# Patient Record
Sex: Male | Born: 1950 | Race: White | Hispanic: No | Marital: Single | Smoking: Former smoker
Health system: Southern US, Community
[De-identification: ages and names within clinical notes are randomized; demographics above are authoritative.]

## PROBLEM LIST (undated history)

## (undated) DIAGNOSIS — E119 Type 2 diabetes mellitus without complications: Secondary | ICD-10-CM

## (undated) DIAGNOSIS — I10 Essential (primary) hypertension: Secondary | ICD-10-CM

## (undated) DIAGNOSIS — I2510 Atherosclerotic heart disease of native coronary artery without angina pectoris: Secondary | ICD-10-CM

## (undated) DIAGNOSIS — I714 Abdominal aortic aneurysm, without rupture, unspecified: Secondary | ICD-10-CM

## (undated) HISTORY — PX: CHOLECYSTECTOMY: SHX55

## (undated) HISTORY — PX: CORONARY ARTERY BYPASS GRAFT: SHX141

## (undated) HISTORY — PX: SP ENDO REPAIR INFRAREN AAA: HXRAD399

---

## 2017-11-23 ENCOUNTER — Other Ambulatory Visit: Payer: Self-pay

## 2017-11-23 ENCOUNTER — Inpatient Hospital Stay (HOSPITAL_COMMUNITY): Payer: Medicare Other

## 2017-11-23 ENCOUNTER — Encounter (HOSPITAL_COMMUNITY): Payer: Self-pay | Admitting: Internal Medicine

## 2017-11-23 ENCOUNTER — Inpatient Hospital Stay (HOSPITAL_COMMUNITY)
Admission: AD | Admit: 2017-11-23 | Discharge: 2017-11-28 | DRG: 872 | Disposition: A | Discharge status: Home / Self Care | Payer: Medicare Other | Source: Other Acute Inpatient Hospital | Attending: Internal Medicine | Admitting: Internal Medicine

## 2017-11-23 DIAGNOSIS — K8050 Calculus of bile duct without cholangitis or cholecystitis without obstruction: Secondary | ICD-10-CM

## 2017-11-23 DIAGNOSIS — I10 Essential (primary) hypertension: Secondary | ICD-10-CM | POA: Diagnosis present

## 2017-11-23 DIAGNOSIS — R0902 Hypoxemia: Secondary | ICD-10-CM | POA: Diagnosis present

## 2017-11-23 DIAGNOSIS — R748 Abnormal levels of other serum enzymes: Secondary | ICD-10-CM | POA: Diagnosis present

## 2017-11-23 DIAGNOSIS — R7989 Other specified abnormal findings of blood chemistry: Secondary | ICD-10-CM | POA: Diagnosis present

## 2017-11-23 DIAGNOSIS — E876 Hypokalemia: Secondary | ICD-10-CM | POA: Diagnosis not present

## 2017-11-23 DIAGNOSIS — K8309 Other cholangitis: Secondary | ICD-10-CM

## 2017-11-23 DIAGNOSIS — K921 Melena: Secondary | ICD-10-CM | POA: Diagnosis not present

## 2017-11-23 DIAGNOSIS — A419 Sepsis, unspecified organism: Secondary | ICD-10-CM | POA: Diagnosis present

## 2017-11-23 DIAGNOSIS — K8030 Calculus of bile duct with cholangitis, unspecified, without obstruction: Secondary | ICD-10-CM | POA: Diagnosis present

## 2017-11-23 DIAGNOSIS — D649 Anemia, unspecified: Secondary | ICD-10-CM | POA: Diagnosis present

## 2017-11-23 DIAGNOSIS — K5710 Diverticulosis of small intestine without perforation or abscess without bleeding: Secondary | ICD-10-CM | POA: Diagnosis present

## 2017-11-23 DIAGNOSIS — Z86718 Personal history of other venous thrombosis and embolism: Secondary | ICD-10-CM

## 2017-11-23 DIAGNOSIS — E119 Type 2 diabetes mellitus without complications: Secondary | ICD-10-CM | POA: Diagnosis present

## 2017-11-23 DIAGNOSIS — R17 Unspecified jaundice: Secondary | ICD-10-CM | POA: Diagnosis not present

## 2017-11-23 DIAGNOSIS — Z79899 Other long term (current) drug therapy: Secondary | ICD-10-CM | POA: Diagnosis not present

## 2017-11-23 DIAGNOSIS — I480 Paroxysmal atrial fibrillation: Secondary | ICD-10-CM | POA: Diagnosis not present

## 2017-11-23 DIAGNOSIS — K831 Obstruction of bile duct: Secondary | ICD-10-CM

## 2017-11-23 DIAGNOSIS — Z7984 Long term (current) use of oral hypoglycemic drugs: Secondary | ICD-10-CM | POA: Diagnosis not present

## 2017-11-23 DIAGNOSIS — I5030 Unspecified diastolic (congestive) heart failure: Secondary | ICD-10-CM | POA: Diagnosis not present

## 2017-11-23 DIAGNOSIS — A4151 Sepsis due to Escherichia coli [E. coli]: Secondary | ICD-10-CM | POA: Diagnosis present

## 2017-11-23 DIAGNOSIS — Z87891 Personal history of nicotine dependence: Secondary | ICD-10-CM | POA: Diagnosis not present

## 2017-11-23 DIAGNOSIS — Z23 Encounter for immunization: Secondary | ICD-10-CM | POA: Diagnosis present

## 2017-11-23 DIAGNOSIS — Z7982 Long term (current) use of aspirin: Secondary | ICD-10-CM

## 2017-11-23 DIAGNOSIS — R945 Abnormal results of liver function studies: Secondary | ICD-10-CM | POA: Diagnosis present

## 2017-11-23 DIAGNOSIS — R6520 Severe sepsis without septic shock: Secondary | ICD-10-CM | POA: Diagnosis present

## 2017-11-23 DIAGNOSIS — I4891 Unspecified atrial fibrillation: Secondary | ICD-10-CM | POA: Diagnosis present

## 2017-11-23 DIAGNOSIS — N179 Acute kidney failure, unspecified: Secondary | ICD-10-CM | POA: Diagnosis not present

## 2017-11-23 DIAGNOSIS — Z951 Presence of aortocoronary bypass graft: Secondary | ICD-10-CM | POA: Diagnosis not present

## 2017-11-23 DIAGNOSIS — I2510 Atherosclerotic heart disease of native coronary artery without angina pectoris: Secondary | ICD-10-CM | POA: Diagnosis present

## 2017-11-23 DIAGNOSIS — D696 Thrombocytopenia, unspecified: Secondary | ICD-10-CM | POA: Diagnosis present

## 2017-11-23 DIAGNOSIS — R7881 Bacteremia: Secondary | ICD-10-CM

## 2017-11-23 DIAGNOSIS — I714 Abdominal aortic aneurysm, without rupture, unspecified: Secondary | ICD-10-CM | POA: Diagnosis present

## 2017-11-23 DIAGNOSIS — E1159 Type 2 diabetes mellitus with other circulatory complications: Secondary | ICD-10-CM | POA: Diagnosis present

## 2017-11-23 DIAGNOSIS — Z7901 Long term (current) use of anticoagulants: Secondary | ICD-10-CM

## 2017-11-23 HISTORY — DX: Abdominal aortic aneurysm, without rupture: I71.4

## 2017-11-23 HISTORY — DX: Type 2 diabetes mellitus without complications: E11.9

## 2017-11-23 HISTORY — DX: Essential (primary) hypertension: I10

## 2017-11-23 HISTORY — DX: Abdominal aortic aneurysm, without rupture, unspecified: I71.40

## 2017-11-23 HISTORY — DX: Atherosclerotic heart disease of native coronary artery without angina pectoris: I25.10

## 2017-11-23 LAB — BASIC METABOLIC PANEL
ANION GAP: 11 (ref 5–15)
BUN: 21 mg/dL (ref 8–23)
CALCIUM: 7 mg/dL — AB (ref 8.9–10.3)
CHLORIDE: 107 mmol/L (ref 98–111)
CO2: 19 mmol/L — AB (ref 22–32)
Creatinine, Ser: 1.28 mg/dL — ABNORMAL HIGH (ref 0.61–1.24)
GFR calc non Af Amer: 56 mL/min — ABNORMAL LOW (ref >60)
GLUCOSE: 159 mg/dL — AB (ref 70–99)
Potassium: 4.1 mmol/L (ref 3.5–5.1)
Sodium: 137 mmol/L (ref 135–145)

## 2017-11-23 LAB — URINALYSIS, ROUTINE W REFLEX MICROSCOPIC
BACTERIA UA: NONE SEEN
Glucose, UA: NEGATIVE mg/dL
HGB URINE DIPSTICK: NEGATIVE
Ketones, ur: NEGATIVE mg/dL
LEUKOCYTES UA: NEGATIVE
NITRITE: NEGATIVE
PROTEIN: 30 mg/dL — AB
Specific Gravity, Urine: 1.028 (ref 1.005–1.030)
pH: 5 (ref 5.0–8.0)

## 2017-11-23 LAB — GLUCOSE, CAPILLARY
GLUCOSE-CAPILLARY: 114 mg/dL — AB (ref 70–99)
Glucose-Capillary: 144 mg/dL — ABNORMAL HIGH (ref 70–99)
Glucose-Capillary: 148 mg/dL — ABNORMAL HIGH (ref 70–99)
Glucose-Capillary: 149 mg/dL — ABNORMAL HIGH (ref 70–99)
Glucose-Capillary: 137 mg/dL — ABNORMAL HIGH (ref 70–99)
Glucose-Capillary: 103 mg/dL — ABNORMAL HIGH (ref 70–99)

## 2017-11-23 LAB — HIV ANTIBODY (ROUTINE TESTING W REFLEX): HIV Screen 4th Generation wRfx: NONREACTIVE

## 2017-11-23 LAB — CBC WITH DIFFERENTIAL/PLATELET
BASOS ABS: 0.1 10*3/uL (ref 0.0–0.1)
Basophils Relative: 1 %
EOS ABS: 0.1 10*3/uL (ref 0.0–0.7)
Eosinophils Relative: 1 %
HCT: 34.2 % — ABNORMAL LOW (ref 39.0–52.0)
HEMOGLOBIN: 11.5 g/dL — AB (ref 13.0–17.0)
LYMPHS PCT: 3 %
Lymphs Abs: 0.4 10*3/uL — ABNORMAL LOW (ref 0.7–4.0)
MCH: 29.6 pg (ref 26.0–34.0)
MCHC: 33.6 g/dL (ref 30.0–36.0)
MCV: 87.9 fL (ref 78.0–100.0)
MONOS PCT: 4 %
Monocytes Absolute: 0.6 10*3/uL (ref 0.1–1.0)
NEUTROS ABS: 12.9 10*3/uL — AB (ref 1.7–7.7)
Neutrophils Relative %: 91 %
Platelets: 103 10*3/uL — ABNORMAL LOW (ref 150–400)
RBC: 3.89 MIL/uL — ABNORMAL LOW (ref 4.22–5.81)
RDW: 14.8 % (ref 11.5–15.5)
WBC Morphology: INCREASED
WBC: 14.1 10*3/uL — AB (ref 4.0–10.5)

## 2017-11-23 LAB — PROCALCITONIN: Procalcitonin: 3.73 ng/mL

## 2017-11-23 LAB — LACTIC ACID, PLASMA
LACTIC ACID, VENOUS: 1.9 mmol/L (ref 0.5–1.9)
LACTIC ACID, VENOUS: 1.6 mmol/L (ref 0.5–1.9)

## 2017-11-23 LAB — HEPATIC FUNCTION PANEL
ALBUMIN: 2.6 g/dL — AB (ref 3.5–5.0)
ALK PHOS: 106 U/L (ref 38–126)
ALT: 125 U/L — AB (ref 0–44)
AST: 91 U/L — AB (ref 15–41)
BILIRUBIN DIRECT: 4.1 mg/dL — AB (ref 0.0–0.2)
BILIRUBIN TOTAL: 5.9 mg/dL — AB (ref 0.3–1.2)
Indirect Bilirubin: 1.8 mg/dL — ABNORMAL HIGH (ref 0.3–0.9)
Total Protein: 5.3 g/dL — ABNORMAL LOW (ref 6.5–8.1)

## 2017-11-23 LAB — APTT: aPTT: 35 seconds (ref 24–36)

## 2017-11-23 LAB — MRSA PCR SCREENING: MRSA by PCR: NEGATIVE

## 2017-11-23 LAB — PROTIME-INR
INR: 1.92
Prothrombin Time: 21.8 seconds — ABNORMAL HIGH (ref 11.4–15.2)

## 2017-11-23 MED ORDER — ONDANSETRON HCL 4 MG PO TABS: 4.0000 mg | ORAL_TABLET | Freq: Four times a day (QID) | ORAL | Status: DC | PRN

## 2017-11-23 MED ORDER — VANCOMYCIN HCL IN DEXTROSE 750-5 MG/150ML-% IV SOLN
750.0000 mg | Freq: Two times a day (BID) | INTRAVENOUS | Status: DC
  Filled 2017-11-23: qty 150

## 2017-11-23 MED ORDER — SODIUM CHLORIDE 0.9 % IV SOLN
INTRAVENOUS | Status: DC
  Administered 2017-11-23 – 2017-11-24 (×2): via INTRAVENOUS

## 2017-11-23 MED ORDER — VANCOMYCIN HCL IN DEXTROSE 1-5 GM/200ML-% IV SOLN: 1000.0000 mg | Freq: Once | INTRAVENOUS | Status: DC

## 2017-11-23 MED ORDER — PNEUMOCOCCAL VAC POLYVALENT 25 MCG/0.5ML IJ INJ
0.5000 mL | INJECTION | INTRAMUSCULAR | Status: AC
  Administered 2017-11-24: 0.5 mL via INTRAMUSCULAR
  Filled 2017-11-23: qty 0.5

## 2017-11-23 MED ORDER — PIPERACILLIN-TAZOBACTAM 3.375 G IVPB
3.3750 g | Freq: Three times a day (TID) | INTRAVENOUS | Status: DC
  Administered 2017-11-23 – 2017-11-25 (×8): 3.375 g via INTRAVENOUS
  Filled 2017-11-23 (×9): qty 50

## 2017-11-23 MED ORDER — ACETAMINOPHEN 325 MG PO TABS
650.0000 mg | ORAL_TABLET | Freq: Four times a day (QID) | ORAL | Status: DC | PRN
  Administered 2017-11-24: 650 mg via ORAL
  Filled 2017-11-23: qty 2

## 2017-11-23 MED ORDER — MORPHINE SULFATE (PF) 2 MG/ML IV SOLN
2.0000 mg | INTRAVENOUS | Status: DC | PRN
  Administered 2017-11-23: 2 mg via INTRAVENOUS
  Filled 2017-11-23: qty 1

## 2017-11-23 MED ORDER — INSULIN ASPART 100 UNIT/ML ~~LOC~~ SOLN
0.0000 [IU] | SUBCUTANEOUS | Status: DC
  Administered 2017-11-23 (×3): 1 [IU] via SUBCUTANEOUS
  Administered 2017-11-24 (×2): 7 [IU] via SUBCUTANEOUS
  Administered 2017-11-24: 1 [IU] via SUBCUTANEOUS
  Administered 2017-11-25: 5 [IU] via SUBCUTANEOUS
  Administered 2017-11-25: 7 [IU] via SUBCUTANEOUS
  Administered 2017-11-25: 3 [IU] via SUBCUTANEOUS

## 2017-11-23 MED ORDER — SODIUM CHLORIDE 0.9 % IV SOLN
INTRAVENOUS | Status: DC
  Administered 2017-11-23 – 2017-11-26 (×4): via INTRAVENOUS

## 2017-11-23 MED ORDER — ACETAMINOPHEN 650 MG RE SUPP: 650.0000 mg | Freq: Four times a day (QID) | RECTAL | Status: DC | PRN

## 2017-11-23 MED ORDER — PIPERACILLIN-TAZOBACTAM 3.375 G IVPB 30 MIN: 3.3750 g | Freq: Once | INTRAVENOUS | Status: DC

## 2017-11-23 MED ORDER — ONDANSETRON HCL 4 MG/2ML IJ SOLN: 4.0000 mg | Freq: Four times a day (QID) | INTRAMUSCULAR | Status: DC | PRN

## 2017-11-23 NOTE — H&P (View-Only) (Signed)
[facility] CONS[facility] Reason for consult: CBD stone Referring Physician: [facility].  Patient transferred from [facility] and accepted by the hospital team.  Perry Cameron is an 67 y.o. male.  HPI: ER in [place] called last night.  I told them that GI does not admit patients and they would need to call the hospital team they wanted to transfer to [facility].  This apparently happened.  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  Since then he has had intermittent symptoms and for the past week has had fairly severe abdominal pain.  His other problems include CABG for CAD, history of endoscopic repair of a infrarenal AAA with the patient being on chronic Eliquis.  For the past week he has been quite sick and has been vomiting and not able to take medications or eat.  He adamantly states that his last dose of Eliquis was Friday morning and is been unable to take any medication since.  His symptoms became worse and he went to the emergency room in [place].  Ultrasound showed dilated duct with CBD 1.4 cm and CTs showed multiple filling defects in the CBD.  Patient apparently appeared to be septic and had a WBC of 13,000 with total bilirubin of 7 and received fluid bolus.  He received a total of 5 L of fluid.  CT scan was done and the report on CT shows dilated extrahepatic duct with at least 3 filling defects.  Patient has been started on broad-spectrum antibiotics and feels much better today with minimal pain or symptoms.  Past Medical History:  Diagnosis Date  . AAA (abdominal aortic aneurysm) ([place])   . Coronary artery disease   . Diabetes mellitus without complication ([place])   . Hypertension     Past Surgical History:  Procedure Laterality Date  . CHOLECYSTECTOMY    . CORONARY ARTERY BYPASS GRAFT    . SP ENDO REPAIR INFRAREN AAA      Family History  Problem Relation Age of Onset  . Diabetes Mellitus II Sister   . CAD Brother    . Diabetes Mellitus II Brother     Social History:  reports that he has quit smoking. He has never used smokeless tobacco. He reports that he drank alcohol. His drug history is not on file.  Allergies: No Known Allergies  Medications; Prior to Admission medications   Not on File   . insulin aspart  0-9 Units Subcutaneous Q4H   PRN Meds acetaminophen **OR** acetaminophen, morphine injection, ondansetron **OR** ondansetron (ZOFRAN) IV Results for orders placed or performed during the hospital encounter of 11/23/17 (from the past 48 hour(s))  Glucose, capillary     Status: Abnormal   Collection Time: 11/23/17  2:18 AM  Result Value Ref Range   Glucose-Capillary 144 (H) 70 - 99 mg/dL  Glucose, capillary     Status: Abnormal   Collection Time: 11/23/17  4:21 AM  Result Value Ref Range   Glucose-Capillary 148 (H) 70 - 99 mg/dL  CBC with Differential     Status: Abnormal   Collection Time: 11/23/17  5:05 AM  Result Value Ref Range   WBC 14.1 (H) 4.0 - 10.5 K/uL   RBC 3.89 (L) 4.22 - 5.81 MIL/uL   Hemoglobin 11.5 (L) 13.0 - 17.0 g/dL   HCT 34.2 (L) 39.0 - 52.0 %   MCV 87.9 78.0 - 100.0 fL   MCH 29.6 26.0 - 34.0 pg   MCHC 33.6 30.0 -  36.0 g/dL   RDW 14.8 11.5 - 15.5 %   Platelets 103 (L) 150 - 400 K/uL    Comment: PLATELET COUNT CONFIRMED BY SMEAR   Neutrophils Relative % 91 %   Lymphocytes Relative 3 %   Monocytes Relative 4 %   Eosinophils Relative 1 %   Basophils Relative 1 %   Neutro Abs 12.9 (H) 1.7 - 7.7 K/uL   Lymphs Abs 0.4 (L) 0.7 - 4.0 K/uL   Monocytes Absolute 0.6 0.1 - 1.0 K/uL   Eosinophils Absolute 0.1 0.0 - 0.7 K/uL   Basophils Absolute 0.1 0.0 - 0.1 K/uL   WBC Morphology INCREASED BANDS (>20% BANDS)     Comment: MILD LEFT SHIFT (1-5% METAS, OCC MYELO, OCC BANDS) VACUOLATED NEUTROPHILS Performed at [facility] [address] [place], [place] [address]   Lactic acid, plasma     Status: None   Collection Time: 11/23/17  5:05 AM  Result Value Ref  Range   Lactic Acid, Venous 1.9 0.5 - 1.9 mmol/L    Comment: Performed at [facility] [address] [place], [place] [address]  Procalcitonin     Status: None   Collection Time: 11/23/17  5:05 AM  Result Value Ref Range   Procalcitonin 3.73 ng/mL    Comment:        Interpretation: PCT > 2 ng/mL: Systemic infection (sepsis) is likely, unless other causes are known. (NOTE)       Sepsis PCT Algorithm           Lower Respiratory Tract                                      Infection PCT Algorithm    ----------------------------     ----------------------------         PCT < 0.25 ng/mL                PCT < 0.10 ng/mL         Strongly encourage             Strongly discourage   discontinuation of antibiotics    initiation of antibiotics    ----------------------------     -----------------------------       PCT 0.25 - 0.50 ng/mL            PCT 0.10 - 0.25 ng/mL               OR       >80% decrease in PCT            Discourage initiation of                                            antibiotics      Encourage discontinuation           of antibiotics    ----------------------------     -----------------------------         PCT >= 0.50 ng/mL              PCT 0.26 - 0.50 ng/mL               AND       <80% decrease in PCT  Encourage initiation of                                             antibiotics       Encourage continuation           of antibiotics    ----------------------------     -----------------------------        PCT >= 0.50 ng/mL                  PCT > 0.50 ng/mL               AND         increase in PCT                  Strongly encourage                                      initiation of antibiotics    Strongly encourage escalation           of antibiotics                                     -----------------------------                                           PCT <= 0.25 ng/mL                                                 OR                                         > 80% decrease in PCT                                     Discontinue / Do not initiate                                             antibiotics Performed at [facility], [address], [place], [place] [address]   Protime-INR     Status: Abnormal   Collection Time: 11/23/17  5:05 AM  Result Value Ref Range   Prothrombin Time 21.8 (H) 11.4 - 15.2 seconds   INR 1.92     Comment: Performed at [facility] [address] [place], [place] [address]  APTT     Status: None   Collection Time: 11/23/17  5:05 AM  Result Value Ref Range   aPTT 35 24 - 36 seconds    Comment: Performed at [facility] [address] [place], [place] [address]  Basic metabolic panel     Status: Abnormal  Collection Time: 11/23/17  5:05 AM  Result Value Ref Range   Sodium 137 135 - 145 mmol/L   Potassium 4.1 3.5 - 5.1 mmol/L   Chloride 107 98 - 111 mmol/L   CO2 19 (L) 22 - 32 mmol/L   Glucose, Bld 159 (H) 70 - 99 mg/dL   BUN 21 8 - 23 mg/dL   Creatinine, Ser 1.28 (H) 0.61 - 1.24 mg/dL   Calcium 7.0 (L) 8.9 - 10.3 mg/dL   GFR calc non Af Amer 56 (L) >60 mL/min   GFR calc Af Amer >60 >60 mL/min    Comment: (NOTE) The eGFR has been calculated using the CKD EPI equation. This calculation has not been validated in all clinical situations. eGFR's persistently <60 mL/min signify possible Chronic Kidney Disease.    Anion gap 11 5 - 15    Comment: Performed at [facility] [address] [place], [place] [address]  Hepatic function panel     Status: Abnormal   Collection Time: 11/23/17  5:05 AM  Result Value Ref Range   Total Protein 5.3 (L) 6.5 - 8.1 g/dL   Albumin 2.6 (L) 3.5 - 5.0 g/dL   AST 91 (H) 15 - 41 U/L   ALT 125 (H) 0 - 44 U/L   Alkaline Phosphatase 106 38 - 126 U/L   Total Bilirubin 5.9 (H) 0.3 - 1.2 mg/dL   Bilirubin, Direct 4.1 (H) 0.0 - 0.2 mg/dL   Indirect Bilirubin 1.8 (H) 0.3 - 0.9 mg/dL    Comment: Performed at [facility] [address], [place], [place] [address]    Dg Chest Port 1 View  Result Date: 11/23/2017 CLINICAL DATA:  Sepsis. EXAM: PORTABLE CHEST 1 VIEW COMPARISON:  None. FINDINGS: Right central line with tip overlying the distal SVC. Post median sternotomy and CABG. Mild hyperinflation. Mild cardiomegaly. Slight increase interstitial markings. No confluent airspace disease. No pneumothorax or large pleural effusion. No acute osseous abnormalities are seen. IMPRESSION: 1. Mild cardiomegaly. Slight increased interstitial markings may be chronic or mild pulmonary edema. 2. Tip of the right central line overlying the distal SVC. Electronically Signed   By: Jeb Levering M.D.   On: 11/23/2017 04:19              Blood pressure 116/71, pulse (!) 128, temperature 98.4 F (36.9 C), resp. rate 16, height 5' 11"  (1.803 m), weight 83 kg (182 lb 15.7 oz), SpO2 (!) 84 %.  Physical exam:   General--alert white male in no acute distress ENT--slightly icteric Neck--full range of motion Heart--regular rate and rhythm without murmurs or gallops Lungs--clear Abdomen--nondistended soft and nontender with good bowel sounds Psych--alert and oriented   Assessment: 1.  Sepsis secondary to cholangitis.  Patient has obvious stones in the CBD on CT scan and appropriate labs.  He will need an ERCP and sphincterotomy with stone removal or at least placement of the stent.  He has been on Eliquis but his last dose was  just 2 days ago  Plan: We will continue his current antibiotics and keep him n.p.o.  Would like to get him off of the Eliquis completely and will therefore try to set up ERCP with stone extraction or stent placement tomorrow with Dr. Paulita Fujita.  Have discussed the procedure including the risk of bleeding and pancreatitis with the patient.  He understands.   Nancy Fetter 11/23/2017, 6:57 AM   This note was created using voice recognition software and minor errors may  Have occurred  unintentionally. Pager: [phone] If no answer or after hours call [phone]

## 2017-11-23 NOTE — Progress Notes (Signed)
RN received call from [facility] at [facility] in [place], 3/4 blood cultures (+) E. Coli. MD made aware.

## 2017-11-23 NOTE — Anesthesia Preprocedure Evaluation (Addendum)
Anesthesia Evaluation  Patient identified by MRN, date of birth, ID band Patient awake    Reviewed: Allergy & Precautions, NPO status , Patient's Chart, lab work & pertinent test results  Airway Mallampati: III  TM Distance: >3 FB Neck ROM: Full    Dental  (+) Edentulous Upper, Dental Advisory Given   Pulmonary former smoker,    breath sounds clear to auscultation       Cardiovascular hypertension, Pt. on medications and Pt. on home beta blockers + CAD, + CABG and + Peripheral Vascular Disease   Rhythm:Regular Rate:Normal     Neuro/Psych negative neurological ROS     GI/Hepatic negative GI ROS, Neg liver ROS,   Endo/Other  diabetes, Type 2, Oral Hypoglycemic Agents  Renal/GU negative Renal ROS     Musculoskeletal negative musculoskeletal ROS (+)   Abdominal Normal abdominal exam  (+)   Peds  Hematology   Anesthesia Other Findings S/p AAA Repair  Reproductive/Obstetrics                            Lab Results  Component Value Date   WBC 14.1 (H) 11/23/2017   HGB 11.5 (L) 11/23/2017   HCT 34.2 (L) 11/23/2017   MCV 87.9 11/23/2017   PLT 103 (L) 11/23/2017   Lab Results  Component Value Date   CREATININE 1.28 (H) 11/23/2017   BUN 21 11/23/2017   NA 137 11/23/2017   K 4.1 11/23/2017   CL 107 11/23/2017   CO2 19 (L) 11/23/2017   Lab Results  Component Value Date   INR 1.92 11/23/2017     Anesthesia Physical Anesthesia Plan  ASA: III  Anesthesia Plan: General   Post-op Pain Management:    Induction: Intravenous  PONV Risk Score and Plan: 3 and Ondansetron, Midazolam and Treatment may vary due to age or medical condition  Airway Management Planned: Oral ETT  Additional Equipment: None  Intra-op Plan:   Post-operative Plan: Extubation in OR  Informed Consent: I have reviewed the patients History and Physical, chart, labs and discussed the procedure including the  risks, benefits and alternatives for the proposed anesthesia with the patient or authorized representative who has indicated his/her understanding and acceptance.   Dental advisory given  Plan Discussed with: CRNA  Anesthesia Plan Comments:        Anesthesia Quick Evaluation

## 2017-11-23 NOTE — H&P (Signed)
History and Physical    Perry DrapeRicky Lee Cameron VHQ:[id number] DOB: 10/26/1950 DOA: 11/23/2017  PCP: No primary care provider on file.  Patient coming from: Patient was transferred from [place].  Chief Complaint: Abdominal pain.  HPI: Perry Cameron is a 67 y.o. male with history of CAD status post CABG, hypertension, diabetes mellitus type 2, abdominal aortic aneurysm status post repair with history of embolism to left femoral artery status post embolectomy this February 5 months ago on apixaban which patient has not taken for last 2 to 3 days due to persistent nausea vomiting abdominal pain.  Patient states he had cholecystectomy 3 years ago and since then he has developed some constant chronic abdominal discomfort mostly in the epigastric area.  But over the last 3 days pain is acutely worsened with persistent nausea and vomiting.  Denies any diarrhea.  Pain is mostly in the epigastric area.  Has had some subjective feeling of fever and chills.  Denies any chest pain or shortness of breath or productive cough.  [facility] patient was initially hypotensive look septic with the labs showing WBC count of 13,000 total bilirubin of 7 ALT of 200 AST of 170 albumin was 3.2 creatinine of 1.4 lipase normal INR 2.2 platelets 141 hemoglobin 13 patient was placed on subclavian central line and at least 5 L of fluid bolus was given following which patient blood pressure improved.  CAT scan done showed extra and intrahepatic biliary duct dilation with possible obstruction distal CBD.  Since officially does not have any gastroenterologist gastro[facility] Dr. Randa EvensEdwards was consulted and was accepted for further [facility] and may need an ERCP.  On my exam patient states his abdominal pain is resolved but is pending to be coming back.  Pain is mostly epigastric radiating across the upper part of the abdomen.  Persistent nausea vomiting  unable to take his medications.  Denies chest pain shortness of breath productive cough or diarrhea.  ED Course: Patient was a direct admit.  Review of Systems: As per HPI, rest all negative.   Past Medical History:  Diagnosis Date  . AAA (abdominal aortic aneurysm) ([facility])   . Coronary artery disease   . Diabetes mellitus without complication ([facility])   . Hypertension     Past Surgical History:  Procedure Laterality Date  . CHOLECYSTECTOMY    . CORONARY ARTERY BYPASS GRAFT    . SP ENDO REPAIR INFRAREN AAA       reports that he has quit smoking. He has never used smokeless tobacco. He reports that he drank alcohol. His drug history is not on file.  Not on File  Family History  Problem Relation Age of Onset  . Diabetes Mellitus II Sister   . CAD Brother   . Diabetes Mellitus II Brother     Prior to Admission medications   Not on File    Physical Exam: Vitals:   11/23/17 0215  BP: 116/71  Pulse: (!) 128  Resp: 16  Temp: 98.2 F (36.8 C)  TempSrc: Oral  SpO2: (!) 84%      Constitutional: Moderately built and nourished. Vitals:   11/23/17 0215  BP: 116/71  Pulse: (!) 128  Resp: 16  Temp: 98.2 F (36.8 C)  TempSrc: Oral  SpO2: (!) 84%   Eyes: Anicteric no pallor. ENMT: No discharge from the ears eyes nose or mouth. Neck: No mass felt.  No neck rigidity.  No  JVD appreciated. Respiratory: No rhonchi or crepitations. Cardiovascular: S1-S2 heard no murmurs appreciated. Abdomen: Soft nontender bowel sounds present.  No guarding or rigidity. Musculoskeletal: No edema.  No joint effusion. Skin: No rash.  Skin appears warm. Neurologic: Alert awake oriented to time place and person.  Moves all extremities. Psychiatric: Appears normal per normal affect.   Labs on Admission: I have personally reviewed following labs and imaging studies  CBC: No results for input(s): WBC, NEUTROABS, HGB, HCT, MCV, PLT in the last 168 hours. Basic Metabolic Panel: No results  for input(s): NA, K, CL, CO2, GLUCOSE, BUN, CREATININE, CALCIUM, MG, PHOS in the last 168 hours. GFR: CrCl cannot be calculated (No order found.). Liver Function Tests: No results for input(s): AST, ALT, ALKPHOS, BILITOT, PROT, ALBUMIN in the last 168 hours. No results for input(s): LIPASE, AMYLASE in the last 168 hours. No results for input(s): AMMONIA in the last 168 hours. Coagulation Profile: No results for input(s): INR, PROTIME in the last 168 hours. Cardiac Enzymes: No results for input(s): CKTOTAL, CKMB, CKMBINDEX, TROPONINI in the last 168 hours. BNP (last 3 results) No results for input(s): PROBNP in the last 8760 hours. HbA1C: No results for input(s): HGBA1C in the last 72 hours. CBG: Recent Labs  Lab 11/23/17 0218  GLUCAP 144*   Lipid Profile: No results for input(s): CHOL, HDL, LDLCALC, TRIG, CHOLHDL, LDLDIRECT in the last 72 hours. Thyroid Function Tests: No results for input(s): TSH, T4TOTAL, FREET4, T3FREE, THYROIDAB in the last 72 hours. Anemia Panel: No results for input(s): VITAMINB12, FOLATE, FERRITIN, TIBC, IRON, RETICCTPCT in the last 72 hours. Urine analysis: No results found for: COLORURINE, APPEARANCEUR, LABSPEC, PHURINE, GLUCOSEU, HGBUR, BILIRUBINUR, KETONESUR, PROTEINUR, UROBILINOGEN, NITRITE, LEUKOCYTESUR Sepsis Labs: @LABRCNTIP (procalcitonin:4,lacticidven:4) )No results found for this or any previous visit (from the past 240 hour(s)).   Radiological Exams on Admission: No results found.  EKG: Independently reviewed.  EKG done at another hospital showing normal sinus rhythm.  Assessment/Plan Principal Problem:   Sepsis (HCC) Active Problems:   Jaundice   Elevated LFTs   CAD (coronary artery disease)   AAA (abdominal aortic aneurysm) ([facility])   Essential hypertension   Type 2 diabetes mellitus with vascular disease ([facility])    1. Sepsis secondary to most likely intra-abdominal cause possible cholangitis with possible obstruction of the CBD -Dr.  Randa Evens on-call gastroenterologist has been consulted.  Will keep patient n.p.o. in anticipation of possible ERCP.  Repeat labs including LFTs CBC metabolic panel are pending.  Continue with hydration and I have placed patient on empiric antibiotics. 2. Diabetes mellitus type 2 we will keep patient on sliding scale coverage. 3. Hypertension presently is in septic picture.  Holding antihypertensives. 4. History of CAD status post CABG denies any chest pain. 5. History of abdominal aortic aneurysm status post endovascular repair complicated with embolization to left femoral artery on apixaban which patient has not taken for last 2 days due to persistent vomiting.  In anticipation of procedure we are holding of apixaban.  CT scan does not show any concerning features for the abdominal aortic aneurysm. 6. Anemia no old labs to compare follow CBC. 7. Mild thrombocytopenia likely could be from sepsis follow CBC. 8. Possible chronic kidney disease follow metabolic panel.  All labs including LFTs metabolic panel CBC EKG chest x-ray are pending.  Note that patient has a right subclavian central line placed at that the hospital.   DVT prophylaxis: SCDs. Code Status: Full code. Family Communication: Discussed with patient. Disposition Plan: Home. Consults called: Solicitor.  Admission status: Inpatient.   Eduard Clos MD [facility] Pager [phone].  If 7PM-7AM, please contact night-coverage [email or web address] Password [email or web address]  11/23/2017, 3:46 AM

## 2017-11-23 NOTE — Consult Note (Signed)
[facility] CONSULT Reason for consult: CBD stone Referring Physician: [facility].  Patient transferred from [facility] and accepted by the hospital team.  Perry Cameron is an 67 y.o. male.  HPI: ER in [place] called last night.  I told them that GI does not admit patients and they would need to call the hospital team they wanted to transfer to [facility].  This apparently happened.  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  Since then he has had intermittent symptoms and for the past week has had fairly severe abdominal pain.  His other problems include CABG for CAD, history of endoscopic repair of a infrarenal AAA with the patient being on chronic Eliquis.  For the past week he has been quite sick and has been vomiting and not able to take medications or eat.  He adamantly states that his last dose of Eliquis was Friday morning and is been unable to take any medication since.  His symptoms became worse and he went to the emergency room in [place].  Ultrasound showed dilated duct with CBD 1.4 cm and CTs showed multiple filling defects in the CBD.  Patient apparently appeared to be septic and had a WBC of 13,000 with total bilirubin of 7 and received fluid bolus.  He received a total of 5 L of fluid.  CT scan was done and the report on CT shows dilated extrahepatic duct with at least 3 filling defects.  Patient has been started on broad-spectrum antibiotics and feels much better today with minimal pain or symptoms.  Past Medical History:  Diagnosis Date  . AAA (abdominal aortic aneurysm) ([place])   . Coronary artery disease   . Diabetes mellitus without complication ([name])   . Hypertension     Past Surgical History:  Procedure Laterality Date  . CHOLECYSTECTOMY    . CORONARY ARTERY BYPASS GRAFT    . SP ENDO REPAIR INFRAREN AAA      Family History  Problem Relation Age of Onset  . Diabetes Mellitus II Sister   . CAD Brother    . Diabetes Mellitus II Brother     Social History:  reports that he has quit smoking. He has never used smokeless tobacco. He reports that he drank alcohol. His drug history is not on file.  Allergies: No Known Allergies  Medications; Prior to Admission medications   Not on File   . insulin aspart  0-9 Units Subcutaneous Q4H   PRN Meds acetaminophen **OR** acetaminophen, morphine injection, ondansetron **OR** ondansetron (ZOFRAN) IV Results for orders placed or performed during the hospital encounter of 11/23/17 (from the past 48 hour(s))  Glucose, capillary     Status: Abnormal   Collection Time: 11/23/17  2:18 AM  Result Value Ref Range   Glucose-Capillary 144 (H) 70 - 99 mg/dL  Glucose, capillary     Status: Abnormal   Collection Time: 11/23/17  4:21 AM  Result Value Ref Range   Glucose-Capillary 148 (H) 70 - 99 mg/dL  CBC with Differential     Status: Abnormal   Collection Time: 11/23/17  5:05 AM  Result Value Ref Range   WBC 14.1 (H) 4.0 - 10.5 K/uL   RBC 3.89 (L) 4.22 - 5.81 MIL/uL   Hemoglobin 11.5 (L) 13.0 - 17.0 g/dL   HCT 34.2 (L) 39.0 - 52.0 %   MCV 87.9 78.0 - 100.0 fL   MCH 29.6 26.0 - 34.0 pg   MCHC 33.6 30.0 -  36.0 g/dL   RDW 14.8 11.5 - 15.5 %   Platelets 103 (L) 150 - 400 K/uL    Comment: PLATELET COUNT CONFIRMED BY SMEAR   Neutrophils Relative % 91 %   Lymphocytes Relative 3 %   Monocytes Relative 4 %   Eosinophils Relative 1 %   Basophils Relative 1 %   Neutro Abs 12.9 (H) 1.7 - 7.7 K/uL   Lymphs Abs 0.4 (L) 0.7 - 4.0 K/uL   Monocytes Absolute 0.6 0.1 - 1.0 K/uL   Eosinophils Absolute 0.1 0.0 - 0.7 K/uL   Basophils Absolute 0.1 0.0 - 0.1 K/uL   WBC Morphology INCREASED BANDS (>20% BANDS)     Comment: MILD LEFT SHIFT (1-5% METAS, OCC MYELO, OCC BANDS) VACUOLATED NEUTROPHILS Performed at [facility] [address] [place], [place] [address]   Lactic acid, plasma     Status: None   Collection Time: 11/23/17  5:05 AM  Result Value Ref  Range   Lactic Acid, Venous 1.9 0.5 - 1.9 mmol/L    Comment: Performed at [facility] [address], [place], [place] [address]  Procalcitonin     Status: None   Collection Time: 11/23/17  5:05 AM  Result Value Ref Range   Procalcitonin 3.73 ng/mL    Comment:        Interpretation: PCT > 2 ng/mL: Systemic infection (sepsis) is likely, unless other causes are known. (NOTE)       Sepsis PCT Algorithm           Lower Respiratory Tract                                      Infection PCT Algorithm    ----------------------------     ----------------------------         PCT < 0.25 ng/mL                PCT < 0.10 ng/mL         Strongly encourage             Strongly discourage   discontinuation of antibiotics    initiation of antibiotics    ----------------------------     -----------------------------       PCT 0.25 - 0.50 ng/mL            PCT 0.10 - 0.25 ng/mL               OR       >80% decrease in PCT            Discourage initiation of                                            antibiotics      Encourage discontinuation           of antibiotics    ----------------------------     -----------------------------         PCT >= 0.50 ng/mL              PCT 0.26 - 0.50 ng/mL               AND       <80% decrease in PCT  Encourage initiation of                                             antibiotics       Encourage continuation           of antibiotics    ----------------------------     -----------------------------        PCT >= 0.50 ng/mL                  PCT > 0.50 ng/mL               AND         increase in PCT                  Strongly encourage                                      initiation of antibiotics    Strongly encourage escalation           of antibiotics                                     -----------------------------                                           PCT <= 0.25 ng/mL                                                 OR                                         > 80% decrease in PCT                                     Discontinue / Do not initiate                                             antibiotics Performed at [facility], [address], [place], [place] [address]   Protime-INR     Status: Abnormal   Collection Time: 11/23/17  5:05 AM  Result Value Ref Range   Prothrombin Time 21.8 (H) 11.4 - 15.2 seconds   INR 1.92     Comment: Performed at [facility] [address], [place], [place] [address]  APTT     Status: None   Collection Time: 11/23/17  5:05 AM  Result Value Ref Range   aPTT 35 24 - 36 seconds    Comment: Performed at [facility] [address] [place], [place] [address]  Basic metabolic panel     Status: Abnormal  Collection Time: 11/23/17  5:05 AM  Result Value Ref Range   Sodium 137 135 - 145 mmol/L   Potassium 4.1 3.5 - 5.1 mmol/L   Chloride 107 98 - 111 mmol/L   CO2 19 (L) 22 - 32 mmol/L   Glucose, Bld 159 (H) 70 - 99 mg/dL   BUN 21 8 - 23 mg/dL   Creatinine, Ser 1.28 (H) 0.61 - 1.24 mg/dL   Calcium 7.0 (L) 8.9 - 10.3 mg/dL   GFR calc non Af Amer 56 (L) >60 mL/min   GFR calc Af Amer >60 >60 mL/min    Comment: (NOTE) The eGFR has been calculated using the CKD EPI equation. This calculation has not been validated in all clinical situations. eGFR's persistently <60 mL/min signify possible Chronic Kidney Disease.    Anion gap 11 5 - 15    Comment: Performed at [facility] [address] [place], [place] [address]  Hepatic function panel     Status: Abnormal   Collection Time: 11/23/17  5:05 AM  Result Value Ref Range   Total Protein 5.3 (L) 6.5 - 8.1 g/dL   Albumin 2.6 (L) 3.5 - 5.0 g/dL   AST 91 (H) 15 - 41 U/L   ALT 125 (H) 0 - 44 U/L   Alkaline Phosphatase 106 38 - 126 U/L   Total Bilirubin 5.9 (H) 0.3 - 1.2 mg/dL   Bilirubin, Direct 4.1 (H) 0.0 - 0.2 mg/dL   Indirect Bilirubin 1.8 (H) 0.3 - 0.9 mg/dL    Comment: Performed at [facility] [address] [place], [place] [address]    Dg Chest Port 1 View  Result Date: 11/23/2017 CLINICAL DATA:  Sepsis. EXAM: PORTABLE CHEST 1 VIEW COMPARISON:  None. FINDINGS: Right central line with tip overlying the distal SVC. Post median sternotomy and CABG. Mild hyperinflation. Mild cardiomegaly. Slight increase interstitial markings. No confluent airspace disease. No pneumothorax or large pleural effusion. No acute osseous abnormalities are seen. IMPRESSION: 1. Mild cardiomegaly. Slight increased interstitial markings may be chronic or mild pulmonary edema. 2. Tip of the right central line overlying the distal SVC. Electronically Signed   By: Jeb Levering M.D.   On: 11/23/2017 04:19              Blood pressure 116/71, pulse (!) 128, temperature 98.4 F (36.9 C), resp. rate 16, height 5' 11"  (1.803 m), weight 83 kg (182 lb 15.7 oz), SpO2 (!) 84 %.  Physical exam:   General--alert white male in no acute distress ENT--slightly icteric Neck--full range of motion Heart--regular rate and rhythm without murmurs or gallops Lungs--clear Abdomen--nondistended soft and nontender with good bowel sounds Psych--alert and oriented   Assessment: 1.  Sepsis secondary to cholangitis.  Patient has obvious stones in the CBD on CT scan and appropriate labs.  He will need an ERCP and sphincterotomy with stone removal or at least placement of the stent.  He has been on Eliquis but his last dose was  just 2 days ago  Plan: We will continue his current antibiotics and keep him n.p.o.  Would like to get him off of the Eliquis completely and will therefore try to set up ERCP with stone extraction or stent placement tomorrow with Dr. Paulita Fujita.  Have discussed the procedure including the risk of bleeding and pancreatitis with the patient.  He understands.   Nancy Fetter 11/23/2017, 6:57 AM   This note was created using voice recognition software and minor errors may  Have occurred  unintentionally. Pager: [phone] If no answer or after hours call [phone]

## 2017-11-23 NOTE — Progress Notes (Signed)
PROGRESS NOTE  Perry Cameron ION:[id number] DOB: 1950/11/03 DOA: 11/23/2017 PCP: No primary care provider on file.  HPI/Recap of past 24 hours:  Reports feeling better, still "sore" from n/v No n/v since arrived to [place], no fever, heart rate and blood pressure has improved, no fever,  Last bm yesterday  Assessment/Plan: Principal Problem:   Sepsis (HCC) Active Problems:   Jaundice   Elevated LFTs   CAD (coronary artery disease)   AAA (abdominal aortic aneurysm) (HCC)   Essential hypertension   Type 2 diabetes mellitus with vascular disease (HCC)  Severe sepsis with ecoli bacteremia, source likely from cholangitis /cbd stone -H/o cholecystectomy  46yrs ago -lft elevated -he had central line placed at outside hospital prior to transfer. cxr central line at distal SVC.  cxr "Mild cardiomegaly. Slight increased interstitial markings may be chronic or mild pulmonary edema" -d/c vanc, continue zosyn, ivf, npo -[facility] consulted, to have ercp in am  Acute hypoxia, o2 sat on presentation was 84%, he is on 2liter o2, denies sob, no edema, lung exam clear -cxr "Mild cardiomegaly. Slight increased interstitial markings may be chronic or mild pulmonary edema" -he is npo, on hydration, will close monitor volume status  Anemia /Thrombocytopenia Mild, no baseline Repeat in am  Elevated Cr  Will get UA No baseline, not sure if acute or chronic Repeat lab in am  noninsulin dependent dm2 Home meds metformin held, on ssi here  CAD s/p CABG in 2000 Denies chest pain cxr "Mild cardiomegaly. Slight increased interstitial markings may be chronic or mild pulmonary edema" Close monitor volume status  afib vs sinus tach with pac's Keep on tele  H/o abdominal aortic aneurysm status post endovascular repair (05/2017)complicated with embolization to left femoral artery on apixaban which patient has not taken for last 2 days due to persistent vomiting.  In anticipation of procedure  we are holding of apixaban.  CT scan does not show any concerning features for the abdominal aortic aneurysm.    Code Status: full   Family Communication: patient   Disposition Plan: home with gi clearance, possible on 7/30   Consultants:  [facility]  Procedures:  Cental line placement on 7/27  Ercp planned on 7/29  Antibiotics:  vanc from admission to 7/28  Zosyn from admission   Objective: BP 116/71 (BP Location: Right Arm)   Pulse (!) 128   Temp 98.6 F (37 C) (Oral)   Resp 16   Ht 5\' 11"  (1.803 m)   Wt 83 kg (182 lb 15.7 oz)   SpO2 (!) 84%   BMI 25.52 kg/m   Intake/Output Summary (Last 24 hours) at 11/23/2017 0836 Last data filed at 11/23/2017 0600 Gross per 24 hour  Intake 243.46 ml  Output --  Net 243.46 ml   Filed Weights   11/23/17 0300  Weight: 83 kg (182 lb 15.7 oz)    Exam: Patient is examined daily including today on 11/23/2017, exams remain the same as of yesterday except that has changed    General:  NAD  Cardiovascular: RRR  Respiratory: CTABL  Abdomen: Soft/ND/NT, positive BS  Musculoskeletal: No Edema  Neuro: alert, oriented   Data Reviewed: Basic Metabolic Panel: Recent Labs  Lab 11/23/17 0505  NA 137  K 4.1  CL 107  CO2 19*  GLUCOSE 159*  BUN 21  CREATININE 1.28*  CALCIUM 7.0*   Liver Function Tests: Recent Labs  Lab 11/23/17 0505  AST 91*  ALT 125*  ALKPHOS 106  BILITOT  5.9*  PROT 5.3*  ALBUMIN 2.6*   No results for input(s): LIPASE, AMYLASE in the last 168 hours. No results for input(s): AMMONIA in the last 168 hours. CBC: Recent Labs  Lab 11/23/17 0505  WBC 14.1*  NEUTROABS 12.9*  HGB 11.5*  HCT 34.2*  MCV 87.9  PLT 103*   Cardiac Enzymes:   No results for input(s): CKTOTAL, CKMB, CKMBINDEX, TROPONINI in the last 168 hours. BNP (last 3 results) No results for input(s): BNP in the last 8760 hours.  ProBNP (last 3 results) No results for input(s): PROBNP in the last 8760  hours.  CBG: Recent Labs  Lab 11/23/17 0218 11/23/17 0421 11/23/17 0811  GLUCAP 144* 148* 149*    No results found for this or any previous visit (from the past 240 hour(s)).   Studies: Dg Chest Port 1 View  Result Date: 11/23/2017 CLINICAL DATA:  Sepsis. EXAM: PORTABLE CHEST 1 VIEW COMPARISON:  None. FINDINGS: Right central line with tip overlying the distal SVC. Post median sternotomy and CABG. Mild hyperinflation. Mild cardiomegaly. Slight increase interstitial markings. No confluent airspace disease. No pneumothorax or large pleural effusion. No acute osseous abnormalities are seen. IMPRESSION: 1. Mild cardiomegaly. Slight increased interstitial markings may be chronic or mild pulmonary edema. 2. Tip of the right central line overlying the distal SVC. Electronically Signed   By: Rubye OaksMelanie  Ehinger M.D.   On: 11/23/2017 04:19    Scheduled Meds: . insulin aspart  0-9 Units Subcutaneous Q4H    Continuous Infusions: . sodium chloride 125 mL/hr at 11/23/17 0600  . sodium chloride Stopped (11/23/17 0511)  . piperacillin-tazobactam (ZOSYN)  IV 12.5 mL/hr at 11/23/17 0600  . vancomycin       Time spent: 35mins I have personally reviewed and interpreted on  11/23/2017 daily labs, tele strips, imagings as discussed above under date review session and assessment and plans.  I reviewed all nursing notes, pharmacy notes, consultant notes,  vitals, pertinent old records  I have discussed plan of care as described above with RN , patient  on 11/23/2017   Albertine GratesFang Antoinette Borgwardt MD, PhD  [facility] Pager [phone]. If 7PM-7AM, please contact night-coverage at [email or web address], password [email or web address] 11/23/2017, 8:36 AM  LOS: 0 days

## 2017-11-23 NOTE — Progress Notes (Signed)
Pharmacy Antibiotic Note  Perry DrapeRicky Lee Cameron is a 67 y.o. male admitted on 11/23/2017 with sepsis with possible GI or urinary source.  Pharmacy has been consulted for Vancocin and Zosyn dosing.  [facility].  Pertinent labs from OSH: WBC 13.3, Hgb 13, Plt 141, SCr 1.52, Na 134, glu 222, alb 3.2, bili 7, AST 170, ALT 200, PT/INR 24.8/2.2  Plan: Vancomycin 1500mg  x1 ([facility]2330  [facility] followed by 750mg  IV every 12 hours.  Goal trough 15-20 mcg/mL. Zosyn 3.375g IV q8h (4 hour infusion).  Height: 5\' 11"  (180.3 cm) Weight: 182 lb 15.7 oz (83 kg) IBW/kg (Calculated) : 75.3  Temp (24hrs), Avg:98.3 F (36.8 C), Min:98.2 F (36.8 C), Max:98.4 F (36.9 C)  No Known Allergies   Thank you for allowing pharmacy to be a part of this patient's care.  Perry GamblesVeronda Indria Cameron, PharmD, BCPS  11/23/2017 4:33 AM

## 2017-11-24 ENCOUNTER — Encounter (HOSPITAL_COMMUNITY): Payer: Self-pay

## 2017-11-24 ENCOUNTER — Inpatient Hospital Stay (HOSPITAL_COMMUNITY): Payer: Medicare Other | Admitting: Anesthesiology

## 2017-11-24 ENCOUNTER — Inpatient Hospital Stay (HOSPITAL_COMMUNITY): Payer: Medicare Other

## 2017-11-24 ENCOUNTER — Encounter (HOSPITAL_COMMUNITY)
Admission: AD | Disposition: A | Discharge status: Home / Self Care | Payer: Self-pay | Source: Other Acute Inpatient Hospital | Attending: Internal Medicine

## 2017-11-24 DIAGNOSIS — I480 Paroxysmal atrial fibrillation: Secondary | ICD-10-CM

## 2017-11-24 DIAGNOSIS — I2510 Atherosclerotic heart disease of native coronary artery without angina pectoris: Secondary | ICD-10-CM

## 2017-11-24 DIAGNOSIS — A419 Sepsis, unspecified organism: Secondary | ICD-10-CM

## 2017-11-24 HISTORY — PX: ERCP: SHX5425

## 2017-11-24 HISTORY — PX: SPHINCTEROTOMY: SHX5544

## 2017-11-24 HISTORY — PX: REMOVAL OF STONES: SHX5545

## 2017-11-24 LAB — COMPREHENSIVE METABOLIC PANEL
ALK PHOS: 94 U/L (ref 38–126)
ALT: 92 U/L — ABNORMAL HIGH (ref 0–44)
ANION GAP: 7 (ref 5–15)
AST: 58 U/L — ABNORMAL HIGH (ref 15–41)
Albumin: 2.3 g/dL — ABNORMAL LOW (ref 3.5–5.0)
BILIRUBIN TOTAL: 3.4 mg/dL — AB (ref 0.3–1.2)
BUN: 19 mg/dL (ref 8–23)
CALCIUM: 6.9 mg/dL — AB (ref 8.9–10.3)
CO2: 23 mmol/L (ref 22–32)
Chloride: 109 mmol/L (ref 98–111)
Creatinine, Ser: 1.08 mg/dL (ref 0.61–1.24)
GFR calc non Af Amer: >60 mL/min (ref >60)
Glucose, Bld: 113 mg/dL — ABNORMAL HIGH (ref 70–99)
POTASSIUM: 3.9 mmol/L (ref 3.5–5.1)
SODIUM: 139 mmol/L (ref 135–145)
TOTAL PROTEIN: 5.2 g/dL — AB (ref 6.5–8.1)

## 2017-11-24 LAB — LACTIC ACID, PLASMA: LACTIC ACID, VENOUS: 1.1 mmol/L (ref 0.5–1.9)

## 2017-11-24 LAB — CBC
HEMATOCRIT: 32.4 % — AB (ref 39.0–52.0)
HEMOGLOBIN: 10.7 g/dL — AB (ref 13.0–17.0)
MCH: 29.6 pg (ref 26.0–34.0)
MCHC: 33 g/dL (ref 30.0–36.0)
MCV: 89.8 fL (ref 78.0–100.0)
Platelets: 120 10*3/uL — ABNORMAL LOW (ref 150–400)
RBC: 3.61 MIL/uL — ABNORMAL LOW (ref 4.22–5.81)
RDW: 15.2 % (ref 11.5–15.5)
WBC: 11.7 10*3/uL — ABNORMAL HIGH (ref 4.0–10.5)

## 2017-11-24 LAB — HEMOGLOBIN AND HEMATOCRIT, BLOOD
HCT: 32.3 % — ABNORMAL LOW (ref 39.0–52.0)
Hemoglobin: 10.7 g/dL — ABNORMAL LOW (ref 13.0–17.0)

## 2017-11-24 LAB — GLUCOSE, CAPILLARY
GLUCOSE-CAPILLARY: 119 mg/dL — AB (ref 70–99)
GLUCOSE-CAPILLARY: 142 mg/dL — AB (ref 70–99)
GLUCOSE-CAPILLARY: 331 mg/dL — AB (ref 70–99)
Glucose-Capillary: 104 mg/dL — ABNORMAL HIGH (ref 70–99)
Glucose-Capillary: 109 mg/dL — ABNORMAL HIGH (ref 70–99)
Glucose-Capillary: 318 mg/dL — ABNORMAL HIGH (ref 70–99)

## 2017-11-24 LAB — TYPE AND SCREEN
ABO/RH(D): A POS
Antibody Screen: NEGATIVE

## 2017-11-24 LAB — HEMOGLOBIN A1C
HEMOGLOBIN A1C: 8 % — AB (ref 4.8–5.6)
Mean Plasma Glucose: 182.9 mg/dL

## 2017-11-24 LAB — ABO/RH: ABO/RH(D): A POS

## 2017-11-24 LAB — LIPASE, BLOOD: LIPASE: 62 U/L — AB (ref 11–51)

## 2017-11-24 SURGERY — ERCP, WITH INTERVENTION IF INDICATED
Anesthesia: General

## 2017-11-24 MED ORDER — GLUCAGON HCL RDNA (DIAGNOSTIC) 1 MG IJ SOLR
INTRAMUSCULAR | Status: AC
  Filled 2017-11-24: qty 1

## 2017-11-24 MED ORDER — DEXAMETHASONE SODIUM PHOSPHATE 10 MG/ML IJ SOLN
INTRAMUSCULAR | Status: DC | PRN
  Administered 2017-11-24: 10 mg via INTRAVENOUS

## 2017-11-24 MED ORDER — ONDANSETRON HCL 4 MG/2ML IJ SOLN
INTRAMUSCULAR | Status: DC | PRN
  Administered 2017-11-24: 4 mg via INTRAVENOUS

## 2017-11-24 MED ORDER — DIGOXIN 0.25 MG/ML IJ SOLN
0.1250 mg | Freq: Once | INTRAMUSCULAR | Status: AC
  Administered 2017-11-24: 0.125 mg via INTRAVENOUS
  Filled 2017-11-24 (×3): qty 2

## 2017-11-24 MED ORDER — SUGAMMADEX SODIUM 200 MG/2ML IV SOLN
INTRAVENOUS | Status: DC | PRN
  Administered 2017-11-24: 170 mg via INTRAVENOUS

## 2017-11-24 MED ORDER — METOPROLOL TARTRATE 12.5 MG HALF TABLET
12.5000 mg | ORAL_TABLET | Freq: Two times a day (BID) | ORAL | Status: DC
  Administered 2017-11-24 – 2017-11-28 (×8): 12.5 mg via ORAL
  Filled 2017-11-24 (×9): qty 1

## 2017-11-24 MED ORDER — INDOMETHACIN 50 MG RE SUPP
RECTAL | Status: DC | PRN
  Administered 2017-11-24: 100 mg via RECTAL

## 2017-11-24 MED ORDER — INDOMETHACIN 50 MG RE SUPP
RECTAL | Status: AC
  Filled 2017-11-24: qty 2

## 2017-11-24 MED ORDER — ROCURONIUM BROMIDE 10 MG/ML (PF) SYRINGE
PREFILLED_SYRINGE | INTRAVENOUS | Status: DC | PRN
  Administered 2017-11-24: 30 mg via INTRAVENOUS

## 2017-11-24 MED ORDER — LIDOCAINE 2% (20 MG/ML) 5 ML SYRINGE
INTRAMUSCULAR | Status: DC | PRN
  Administered 2017-11-24: 50 mg via INTRAVENOUS

## 2017-11-24 MED ORDER — PHENYLEPHRINE 40 MCG/ML (10ML) SYRINGE FOR IV PUSH (FOR BLOOD PRESSURE SUPPORT)
PREFILLED_SYRINGE | INTRAVENOUS | Status: DC | PRN
  Administered 2017-11-24: 120 ug via INTRAVENOUS

## 2017-11-24 MED ORDER — SUCCINYLCHOLINE CHLORIDE 200 MG/10ML IV SOSY
PREFILLED_SYRINGE | INTRAVENOUS | Status: DC | PRN
  Administered 2017-11-24: 140 mg via INTRAVENOUS

## 2017-11-24 MED ORDER — FENTANYL CITRATE (PF) 100 MCG/2ML IJ SOLN
INTRAMUSCULAR | Status: DC | PRN
  Administered 2017-11-24 (×2): 50 ug via INTRAVENOUS

## 2017-11-24 MED ORDER — SODIUM CHLORIDE 0.9 % IV SOLN
INTRAVENOUS | Status: DC | PRN
  Administered 2017-11-24: 50 mL

## 2017-11-24 MED ORDER — IOPAMIDOL (ISOVUE-300) INJECTION 61%
INTRAVENOUS | Status: AC
  Filled 2017-11-24: qty 50

## 2017-11-24 MED ORDER — MIDAZOLAM HCL 2 MG/2ML IJ SOLN
INTRAMUSCULAR | Status: DC | PRN
  Administered 2017-11-24: 2 mg via INTRAVENOUS

## 2017-11-24 MED ORDER — PROPOFOL 10 MG/ML IV BOLUS
INTRAVENOUS | Status: DC | PRN
  Administered 2017-11-24: 130 mg via INTRAVENOUS

## 2017-11-24 MED ORDER — ESMOLOL HCL 100 MG/10ML IV SOLN
INTRAVENOUS | Status: DC | PRN
  Administered 2017-11-24: 20 mg via INTRAVENOUS

## 2017-11-24 MED ORDER — SODIUM CHLORIDE 0.9 % IV BOLUS
1000.0000 mL | Freq: Once | INTRAVENOUS | Status: AC
  Administered 2017-11-24: 1000 mL via INTRAVENOUS

## 2017-11-24 NOTE — Anesthesia Postprocedure Evaluation (Signed)
Anesthesia Post Note  Patient: Perry DrapeRicky Lee Hornaday  Procedure(s) Performed: ENDOSCOPIC RETROGRADE CHOLANGIOPANCREATOGRAPHY (ERCP) (N/A ) SPHINCTEROTOMY REMOVAL OF STONES     Patient location during evaluation: PACU Anesthesia Type: General Level of consciousness: awake and alert Pain management: pain level controlled Vital Signs Assessment: post-procedure vital signs reviewed and stable Respiratory status: spontaneous breathing, nonlabored ventilation, respiratory function stable and patient connected to nasal cannula oxygen Cardiovascular status: blood pressure returned to baseline and stable Postop Assessment: no apparent nausea or vomiting Anesthetic complications: no Comments: Pt converted to Afib in recovery, beta blocker administered for rate control, cardiology consulted.     Last Vitals:  Vitals:   11/24/17 1010 11/24/17 1050  BP: 111/75 118/73  Pulse: 73 (!) 105  Resp: 20   Temp:    SpO2: 97%     Last Pain:  Vitals:   11/24/17 1010  TempSrc:   PainSc: 0-No pain                 Shelton SilvasKevin D Hollis

## 2017-11-24 NOTE — Progress Notes (Signed)
Contacted about afib with elevated rates. Patient with sepsis, afib RVR. Soft bp's limit treatment options, also inability to anticoagulate limits rhythm strategy. No great options.  Now that renal function has improved will give a dose of IV digoxin to see if can at least bring rates down some. Continue oral lopressor.    Perry FerryJ Paulanthony Gleaves MD

## 2017-11-24 NOTE — Plan of Care (Signed)
Pt to remain free of falls.

## 2017-11-24 NOTE — Anesthesia Procedure Notes (Signed)
Procedure Name: Intubation Date/Time: 11/24/2017 8:32 AM Performed by: Leonor Liv, CRNA Pre-anesthesia Checklist: Patient identified, Emergency Drugs available, Suction available and Patient being monitored Patient Re-evaluated:Patient Re-evaluated prior to induction Oxygen Delivery Method: Circle System Utilized Preoxygenation: Pre-oxygenation with 100% oxygen Induction Type: IV induction Ventilation: Oral airway inserted - appropriate to patient size and Mask ventilation with difficulty Laryngoscope Size: Mac and 4 Grade View: Grade I Tube type: Oral Tube size: 7.5 mm Number of attempts: 1 Airway Equipment and Method: Stylet and Oral airway Placement Confirmation: ETT inserted through vocal cords under direct vision,  positive ETCO2 and breath sounds checked- equal and bilateral Secured at: 23 cm Tube secured with: Tape Dental Injury: Teeth and Oropharynx as per pre-operative assessment  Comments: Masking difficult due to thick facial hair

## 2017-11-24 NOTE — Op Note (Signed)
[facility] Patient Name: Perry Cameron Procedure Date : 11/24/2017 MRN: [id number] Attending MD: Willis Modena , MD Date of Birth: 01/20/1951 CSN: [id number] Age: 67 Admit Type: Inpatient Procedure:                ERCP Indications:              Common bile duct stone(s), Biliary dilation on                            Computed Tomogram Scan, Bile duct stone on Computed                            Tomogram Scan, Biliary dilation on Ultrasound, Bile                            duct stone on Ultrasound, Suspected ascending                            cholangitis, Jaundice, Elevated liver enzymes Providers:                Willis Modena, MD, Dwain Sarna, RN, Zoila Shutter,                            Technician, Orvilla Fus, CRNA Referring MD:             [facility] Medicines:                General Anesthesia, Zosyn 3.375 g IV, Indomethacin                            100 mg PR Complications:            No immediate complications. Estimated Blood Loss:     Estimated blood loss was minimal. Procedure:                Pre-Anesthesia Assessment:                           - Prior to the procedure, a History and Physical                            was performed, and patient medications and                            allergies were reviewed. The patient's tolerance of                            previous anesthesia was also reviewed. The risks                            and benefits of the procedure and the sedation                            options and risks were discussed with the patient.  All questions were answered, and informed consent                            was obtained. Prior Anticoagulants: The patient has                            taken Eliquis (apixaban), last dose was 3 days                            prior to procedure. ASA Grade Assessment: III - A                            patient with severe systemic disease. After                reviewing the risks and benefits, the patient was                            deemed in satisfactory condition to undergo the                            procedure.                           After obtaining informed consent, the scope was                            passed under direct vision. Throughout the                            procedure, the patient's blood pressure, pulse, and                            oxygen saturations were monitored continuously. The                            [id number]-[id number] ([id number]) Olympus ERCP was introduced                            through the mouth, and used to inject contrast into                            and used to inject contrast into the bile duct. The                            ERCP was accomplished without difficulty. The                            patient tolerated the procedure well. Scope In: Scope Out: Findings:      The scout film was normal. The major papilla in middle of two "kissing"       diverticula. There was a gaping orifice at the major papilla. The bile       duct was deeply cannulated. Contrast was injected. I personally       interpreted the bile duct  images. Ductal flow of contrast was adequate.       Image quality was adequate. Contrast extended to the bifurcation.       Post-cholecystectomy. No evidence of bile leak. The common bile duct was       moderately dilated. The largest diameter was 15 mm. The main bile duct       contained multiple filling defects consistent with a stone. An 8 mm       biliary sphincterotomy was made with a traction (standard)       sphincterotome using blended current. There was no post-sphincterotomy       bleeding. The biliary tree was swept with a 15 mm balloon and basket       starting at the upper third of the main bile duct. Many stones, soft,       each about 6-8 mm in diameter, were removed. No stones remained on       balloon post-occlusion cholangiogram. Impression:                - Twin "kissing" duodenal diverticula.                           - The major papilla appeared to have a gaping                            orifice, consistent with prior recent stone passage.                           - A filling defect consistent with a stone was seen                            on the cholangiogram.                           - The common bile duct was moderately dilated.                           - Choledocholithiasis was found. Complete removal                            was accomplished by biliary sphincterotomy and                            balloon extraction.                           - A biliary sphincterotomy was performed.                           - The biliary tree was swept. Moderate Sedation:      None Recommendation:           - Avoid aspirin and nonsteroidal anti-inflammatory                            medicines for 3 days.                           - Watch for  pancreatitis, bleeding, perforation,                            and cholangitis.                           - Clear liquid diet today.                           - Resume Eliquis (apixaban) at prior dose in 5 days.                           - Watch for pancreatitis, bleeding, perforation,                            and cholangitis.                           Deboraha Sprang- [facility] will follow. Procedure Code(s):        --- Professional ---                           [phone], Endoscopic retrograde                            cholangiopancreatography (ERCP); with removal of                            calculi/debris from biliary/pancreatic duct(s)                           43262, Endoscopic retrograde                            cholangiopancreatography (ERCP); with                            sphincterotomy/papillotomy Diagnosis Code(s):        --- Professional ---                           K83.8, Other specified diseases of biliary tract                           K80.50, Calculus of bile duct without cholangitis                             or cholecystitis without obstruction                           R17, Unspecified jaundice                           R74.8, Abnormal levels of other serum enzymes                           R93.2, Abnormal findings on diagnostic imaging of  liver and biliary tract CPT copyright 2017 American Medical Association. All rights reserved. The codes documented in this report are preliminary and upon coder review may  be revised to meet current compliance requirements. Willis Modena, MD 11/24/2017 9:24:57 AM This report has been signed electronically. Number of Addenda: 0

## 2017-11-24 NOTE — Transfer of Care (Signed)
Immediate Anesthesia Transfer of Care Note  Patient: Perry Cameron  Procedure(s) Performed: ENDOSCOPIC RETROGRADE CHOLANGIOPANCREATOGRAPHY (ERCP) (N/A ) SPHINCTEROTOMY REMOVAL OF STONES  Patient Location: Endoscopy Unit  Anesthesia Type:General  Level of Consciousness: awake, alert  and oriented  Airway & Oxygen Therapy: Patient Spontanous Breathing and Patient connected to face mask oxygen  Post-op Assessment: Report given to RN, Post -op Vital signs reviewed and stable and Patient moving all extremities  Post vital signs: Reviewed and stable  Last Vitals:  Vitals Value Taken Time  BP 122/71 11/24/2017  9:49 AM  Temp    Pulse 105 11/24/2017  9:57 AM  Resp 27 11/24/2017  9:57 AM  SpO2 97 % 11/24/2017  9:57 AM  Vitals shown include unvalidated device data.  Last Pain:  Vitals:   11/24/17 0940  TempSrc: Oral  PainSc: 0-No pain      Patients Stated Pain Goal: 0 (11/24/17 0453)  Complications: No apparent anesthesia complications Patient appears to be in A-fib on monitor, HR 80s-90s. Dr. Hart RochesterHollis notified.

## 2017-11-24 NOTE — Progress Notes (Signed)
Patient had two very dark brown, almost black stools with some bright red blood mixed in. MD notified and received orders for Type and Screen and H&H.

## 2017-11-24 NOTE — Progress Notes (Addendum)
PROGRESS NOTE  Perry DrapeRicky Lee Cameron ZOX:[id number] DOB: 03-14-1951 DOA: 11/23/2017 PCP: System, Pcp Not In  HPI/Recap of past 24 hours:   Has afib after ercp bp stable, denies chest pain   Addendum: 5;20pm RN paged to report patient had 2 loose very dark brown stool with red blood . Will monitor hgb, transfusion if hemodynamically unstable or hgb trending down  Assessment/Plan: Principal Problem:   Sepsis (HCC) Active Problems:   Jaundice   Elevated LFTs   CAD (coronary artery disease)   AAA (abdominal aortic aneurysm) (HCC)   Essential hypertension   Type 2 diabetes mellitus with vascular disease (HCC)  Severe sepsis with ecoli bacteremia, source likely from cholangitis /cbd stone -H/o cholecystectomy  7881yrs ago -he presented to outside hospital with n/v/ab pain and severe sepsis --he had central line placed and received 5liter fluids bolus at outside hospital prior to transfer. cxr here central line at distal SVC.  cxr "Mild cardiomegaly. Slight increased interstitial markings may be chronic or mild pulmonary edema" -d/c vanc, continue zosyn,  - ercp on 7/29 with CBD stone removal and sphincterectomy   Need to follow up blood culture result from [facility] [place]"    Acute hypoxia, o2 sat on presentation was 84%, he is on 2liter o2, denies sob, no edema, lung exam clear -cxr "Mild cardiomegaly. Slight increased interstitial markings may be chronic or mild pulmonary edema" -monitor volume status, wean oxygen as tolerated  Anemia /Thrombocytopenia Mild, no baseline Monitor, Repeat in am  Elevated Cr , likely AKI, No baseline, UA no bacteria Bun/cr 21/1.28 on 7/28,  bun/cr 19/1.09 on 7/29  noninsulin dependent dm2 Home meds metformin held, on ssi here  CAD s/p CABG in 2000 Denies chest pain cxr "Mild cardiomegaly. Slight increased interstitial markings may be chronic or mild pulmonary edema" Close monitor volume status  Afib -denies prior h/o of  afib -Keep on tele, resume lopressor at decreased dose, titrate up if bp allows -keep K.4, mag>2 -anticoagulation need to be held for 5 days post ERCp/sphinctorectomy per GI -will get echocardiogram, cardiology consult  H/o abdominal aortic aneurysm status post endovascular repair (05/2017)complicated with embolization to left femoral artery on apixaban which patient has not taken for last 2 days due to persistent vomiting.  In anticipation of procedure we are holding of apixaban.  CT scan does not show any concerning features for the abdominal aortic aneurysm.    Code Status: full   Family Communication: patient   Disposition Plan: not ready to discharge   Consultants:  [facility]  cardiology  Procedures:  Cental line placement on 7/27  Ercp  on 7/29  Antibiotics:  vanc from admission to 7/28  Zosyn from admission   Objective: BP 111/75   Pulse 73   Temp 97.7 F (36.5 C) (Oral)   Resp 20   Ht 5\' 11"  (1.803 m)   Wt 84.4 kg (186 lb)   SpO2 97%   BMI 25.94 kg/m   Intake/Output Summary (Last 24 hours) at 11/24/2017 1017 Last data filed at 11/24/2017 [id number] Gross per 24 hour  Intake 1234.62 ml  Output 1225 ml  Net 9.62 ml   Filed Weights   11/23/17 0300 11/24/17 0500 11/24/17 0732  Weight: 83 kg (182 lb 15.7 oz) 84.4 kg (186 lb 1.1 oz) 84.4 kg (186 lb)    Exam: Patient is examined daily including today on 11/24/2017, exams remain the same as of yesterday except that has changed    General:  NAD  Cardiovascular: IRRR  Respiratory: CTABL  Abdomen: Soft/ND/NT, positive BS  Musculoskeletal: No Edema  Neuro: alert, oriented   Data Reviewed: Basic Metabolic Panel: Recent Labs  Lab 11/23/17 0505 11/24/17 0307  NA 137 139  K 4.1 3.9  CL 107 109  CO2 19* 23  GLUCOSE 159* 113*  BUN 21 19  CREATININE 1.28* 1.08  CALCIUM 7.0* 6.9*   Liver Function Tests: Recent Labs  Lab 11/23/17 0505 11/24/17 0307  AST 91* 58*  ALT 125* 92*  ALKPHOS 106 94   BILITOT 5.9* 3.4*  PROT 5.3* 5.2*  ALBUMIN 2.6* 2.3*   Recent Labs  Lab 11/24/17 0307  LIPASE 62*   No results for input(s): AMMONIA in the last 168 hours. CBC: Recent Labs  Lab 11/23/17 0505 11/24/17 0307  WBC 14.1* 11.7*  NEUTROABS 12.9*  --   HGB 11.5* 10.7*  HCT 34.2* 32.4*  MCV 87.9 89.8  PLT 103* 120*   Cardiac Enzymes:   No results for input(s): CKTOTAL, CKMB, CKMBINDEX, TROPONINI in the last 168 hours. BNP (last 3 results) No results for input(s): BNP in the last 8760 hours.  ProBNP (last 3 results) No results for input(s): PROBNP in the last 8760 hours.  CBG: Recent Labs  Lab 11/23/17 1618 11/23/17 2000 11/24/17 0003 11/24/17 0424 11/24/17 0945  GLUCAP 114* 103* 109* 119* 104*    Recent Results (from the past 240 hour(s))  MRSA PCR Screening     Status: None   Collection Time: 11/23/17  6:54 AM  Result Value Ref Range Status   MRSA by PCR NEGATIVE NEGATIVE Final    Comment:        The GeneXpert MRSA Assay (FDA approved for NASAL specimens only), is one component of a comprehensive MRSA colonization surveillance program. It is not intended to diagnose MRSA infection nor to guide or monitor treatment for MRSA infections. Performed at [facility], [address], [place], [place] [address]      Studies: No results found.  Scheduled Meds: . [MAR Hold] insulin aspart  0-9 Units Subcutaneous Q4H  . [MAR Hold] pneumococcal 23 valent vaccine  0.5 mL Intramuscular Tomorrow-1000    Continuous Infusions: . sodium chloride 10 mL/hr at 11/24/17 0449  . [MAR Hold] piperacillin-tazobactam (ZOSYN)  IV 3.375 g (11/24/17 0450)     Time spent: , case discussed with cardiology I have personally reviewed and interpreted on  11/24/2017 daily labs, tele strips, imagings as discussed above under date review session and assessment and plans.  I reviewed all nursing notes, pharmacy notes, consultant notes,  vitals, pertinent old records  I  have discussed plan of care as described above with RN , patient  on 11/24/2017   Albertine Grates MD, PhD  [facility] Pager [phone]. If 7PM-7AM, please contact night-coverage at [email or web address], password [facility] 11/24/2017, 10:17 AM  LOS: 1 day

## 2017-11-24 NOTE — Interval H&P Note (Signed)
History and Physical Interval Note:  11/24/2017 8:10 AM  Perry Cameron  has presented today for surgery, with the diagnosis of cholangitis  The various methods of treatment have been discussed with the patient and family. After consideration of risks, benefits and other options for treatment, the patient has consented to  Procedure(s): ENDOSCOPIC RETROGRADE CHOLANGIOPANCREATOGRAPHY (ERCP) (N/A) as a surgical intervention .  The patient's history has been reviewed, patient examined, no change in status, stable for surgery.  I have reviewed the patient's chart and labs.  Questions were answered to the patient's satisfaction.     Perry Cameron M  Assessment:  1.  Bile duct stones on CT. 2.  Dilated bile duct with elevated LFTs. 3.  Cholangitis, improving on antibiotics. 4.  Chronic anticoagulation, apixaban, off for the past three days.  Plan:  1.  ERCP with anticipated biliary sphincterotomy and bile duct stone extraction. 2.  Risks (up to and including bleeding, infection, perforation, pancreatitis that can be complicated by infected necrosis and death), benefits (removal of stones, alleviating blockage, decreasing risk of cholangitis or choledocholithiasis-related pancreatitis), and alternatives (watchful waiting, percutaneous transhepatic cholangiography) of ERCP were explained to patient/family in detail and patient elects to proceed.

## 2017-11-24 NOTE — Progress Notes (Signed)
After patient was extubated for ERCP, patient was found to be in atrial fibrillation. He is not complaining of any chest pain or discomfort. A 12 lead EKG was obtained and Dr. Rochele PagesXui notified. Patient will return to the floor and await further tests.

## 2017-11-24 NOTE — Consult Note (Signed)
Cardiology Consultation:   Patient ID: Perry Cameron; [id number]; 1951/04/10   Admit date: 11/23/2017 Date of Consult: 11/24/2017  Primary Care Provider: System, Pcp Not In Primary Cardiologist: No primary care provider on file.    Patient Profile:   Perry Cameron is a 67 y.o. male with a hx of cholecystectomy, CAD, endoscopic repair of AAA on apixaban for clot who is being seen today for the evaluation of atrial fibrillation at the request of Dr. Erlinda Hong.  History of Present Illness:   Perry Cameron reports no prior history of atrial fibrillation. He has a cardiologist near his home in [place] who reported that everything was normal prior to his recent AAA repair. He denies any chest pain or shortness of breath, no PND or orthopnea. When I met him, he was ambulating in his room after washing up. He did endorse a recent black stool, but denied lightheadedness or dizzyness. His apixaban is being held currently per GI recommendations re: ERCP.  He denies other active concerns at this time. His nausea, vomiting, and abdominal pain are being managed by his primary team and GI.  Past Medical History:  Diagnosis Date  . AAA (abdominal aortic aneurysm) ([place])   . Coronary artery disease   . Diabetes mellitus without complication ([place])   . Hypertension     Past Surgical History:  Procedure Laterality Date  . CHOLECYSTECTOMY    . CORONARY ARTERY BYPASS GRAFT    . SP ENDO REPAIR INFRAREN AAA       Home Medications:  Prior to Admission medications   Medication Sig Start Date End Date Taking? Authorizing Provider  apixaban (ELIQUIS) 5 MG TABS tablet Take 5 mg by mouth 2 (two) times daily.   Yes [provider]  aspirin EC 81 MG tablet Take 81 mg by mouth daily.   Yes [provider]  atorvastatin (LIPITOR) 40 MG tablet Take 40 mg by mouth daily.   Yes [provider]  gabapentin (NEURONTIN) 100 MG capsule Take 100 mg by mouth 3 (three) times daily.   Yes  [provider]  losartan (COZAAR) 50 MG tablet Take 50 mg by mouth daily.   Yes [provider]  metFORMIN (GLUCOPHAGE-XR) 500 MG 24 hr tablet Take 1,000 mg by mouth 2 (two) times daily.   Yes [provider]  metoprolol tartrate (LOPRESSOR) 50 MG tablet Take 50 mg by mouth 2 (two) times daily.   Yes [provider]  sitaGLIPtin (JANUVIA) 100 MG tablet Take 100 mg by mouth daily.   Yes [provider]    Inpatient Medications: Scheduled Meds: . insulin aspart  0-9 Units Subcutaneous Q4H  . metoprolol tartrate  12.5 mg Oral BID   Continuous Infusions: . sodium chloride 10 mL/hr at 11/24/17 0449  . piperacillin-tazobactam (ZOSYN)  IV 3.375 g (11/24/17 1259)   PRN Meds: acetaminophen **OR** acetaminophen, morphine injection, ondansetron **OR** ondansetron (ZOFRAN) IV  Allergies:   No Known Allergies  Social History:   Social History   Socioeconomic History  . Marital status: Single    Spouse name: Not on file  . Number of children: Not on file  . Years of education: Not on file  . Highest education level: Not on file  Occupational History  . Not on file  Social Needs  . Financial resource strain: Not on file  . Food insecurity:    Worry: Not on file    Inability: Not on file  . Transportation needs:    Medical:  Not on file    Non-medical: Not on file  Tobacco Use  . Smoking status: Former Research scientist (life sciences)  . Smokeless tobacco: Never Used  Substance and Sexual Activity  . Alcohol use: Not Currently  . Drug use: Not on file  . Sexual activity: Not on file  Lifestyle  . Physical activity:    Days per week: Not on file    Minutes per session: Not on file  . Stress: Not on file  Relationships  . Social connections:    Talks on phone: Not on file    Gets together: Not on file    Attends religious service: Not on file    Active member of club or organization: Not on file    Attends meetings of clubs or organizations: Not on file     Relationship status: Not on file  . Intimate partner violence:    Fear of current or ex partner: Not on file    Emotionally abused: Not on file    Physically abused: Not on file    Forced sexual activity: Not on file  Other Topics Concern  . Not on file  Social History Narrative  . Not on file    Family History:    Family History  Problem Relation Age of Onset  . Diabetes Mellitus II Sister   . CAD Brother   . Diabetes Mellitus II Brother      ROS:  Please see the history of present illness.  Review of Systems  Constitutional: Positive for chills, fever and malaise/fatigue. Negative for diaphoresis and weight loss.  HENT: Negative for ear discharge, ear pain, hearing loss and nosebleeds.   Eyes: Negative for blurred vision and pain.  Respiratory: Negative for hemoptysis and shortness of breath.   Cardiovascular: Negative for chest pain, palpitations, orthopnea, claudication, leg swelling and PND.  Gastrointestinal: Positive for abdominal pain, melena, nausea and vomiting.  Genitourinary: Negative for hematuria and urgency.  Musculoskeletal: Negative for falls and myalgias.  Skin: Negative for itching and rash.  Neurological: Negative for focal weakness, loss of consciousness and headaches.  Endo/Heme/Allergies: Does not bruise/bleed easily.   All other ROS reviewed and negative.     Physical Exam/Data:   Vitals:   11/24/17 1738 11/24/17 1801 11/24/17 1802 11/24/17 1805  BP:  93/67 (!) 87/66 93/62  Pulse:  (!) 138 (!) 142 (!) 138  Resp:  18 (!) 26 17  Temp: 97.8 F (36.6 C)     TempSrc: Oral     SpO2:  95% 93% 97%  Weight:      Height:        Intake/Output Summary (Last 24 hours) at 11/24/2017 1830 Last data filed at 11/24/2017 7209 Gross per 24 hour  Intake 1234.62 ml  Output 800 ml  Net 434.62 ml   Filed Weights   11/23/17 0300 11/24/17 0500 11/24/17 0732  Weight: 182 lb 15.7 oz (83 kg) 186 lb 1.1 oz (84.4 kg) 186 lb (84.4 kg)   Body mass index is 25.94  kg/m.  General:  Well nourished, well developed, in no acute distress HEENT: normal Lymph: no adenopathy Neck: no JVD Endocrine:  No thryomegaly Vascular: No carotid bruits; FA pulses 2+ bilaterally without bruits  Cardiac:  normal S1, S2; irregularly irregular; no murmur  Lungs:  clear to auscultation bilaterally, no wheezing, rhonchi or rales  Abd: soft, nontender, no hepatomegaly  Ext: no edema Musculoskeletal:  No deformities, BUE and BLE strength normal and equal Skin: warm and dry  Neuro:  CNs 2-12 intact, no focal abnormalities noted Psych:  Normal affect   EKG:  The EKG was personally reviewed and demonstrates:  Initial NSR, now Atrial fibrillation Telemetry:  Telemetry was personally reviewed and demonstrates:  Atrial fibrilaltion  Relevant CV Studies: none  Laboratory Data:  Chemistry Recent Labs  Lab 11/23/17 0505 11/24/17 0307  NA 137 139  K 4.1 3.9  CL 107 109  CO2 19* 23  GLUCOSE 159* 113*  BUN 21 19  CREATININE 1.28* 1.08  CALCIUM 7.0* 6.9*  GFRNONAA 56* >60  GFRAA >60 >60  ANIONGAP 11 7    Recent Labs  Lab 11/23/17 0505 11/24/17 0307  PROT 5.3* 5.2*  ALBUMIN 2.6* 2.3*  AST 91* 58*  ALT 125* 92*  ALKPHOS 106 94  BILITOT 5.9* 3.4*   Hematology Recent Labs  Lab 11/23/17 0505 11/24/17 0307  WBC 14.1* 11.7*  RBC 3.89* 3.61*  HGB 11.5* 10.7*  HCT 34.2* 32.4*  MCV 87.9 89.8  MCH 29.6 29.6  MCHC 33.6 33.0  RDW 14.8 15.2  PLT 103* 120*   Cardiac EnzymesNo results for input(s): TROPONINI in the last 168 hours. No results for input(s): TROPIPOC in the last 168 hours.  BNPNo results for input(s): BNP, PROBNP in the last 168 hours.  DDimer No results for input(s): DDIMER in the last 168 hours.  Radiology/Studies:  Dg Chest Port 1 View  Result Date: 11/23/2017 CLINICAL DATA:  Sepsis. EXAM: PORTABLE CHEST 1 VIEW COMPARISON:  None. FINDINGS: Right central line with tip overlying the distal SVC. Post median sternotomy and CABG. Mild  hyperinflation. Mild cardiomegaly. Slight increase interstitial markings. No confluent airspace disease. No pneumothorax or large pleural effusion. No acute osseous abnormalities are seen. IMPRESSION: 1. Mild cardiomegaly. Slight increased interstitial markings may be chronic or mild pulmonary edema. 2. Tip of the right central line overlying the distal SVC. Electronically Signed   By: Jeb Levering M.D.   On: 11/23/2017 04:19   Dg Ercp Biliary & Pancreatic Ducts  Result Date: 11/24/2017 CLINICAL DATA:  ERCP with sphincterotomy. EXAM: ERCP TECHNIQUE: Multiple spot images obtained with the fluoroscopic device and submitted for interpretation post-procedure. FLUOROSCOPY TIME:  3 minutes, 52 seconds COMPARISON:  None. FINDINGS: 6 spot intraoperative fluoroscopic images of the right upper abdominal quadrant during ERCP are provided for review. Initial image demonstrates an ERCP probe overlying the right upper abdominal quadrant. Subsequent images demonstrate opacification of the common bile duct which appears moderate to markedly dilated. There are at least a 5 filling defects within the common bile duct worrisome for nonocclusive choledocholithiasis Subsequent image demonstrates insufflation of a balloon within the central aspect of the CBD with subsequent presumed biliary sweeping and biliary sphincterotomy. There is minimal opacification of the central aspect of the intrahepatic biliary tree which appears mildly dilated. There is faint opacification of the central aspect of the cystic duct. There is no definitive opacification of the pancreatic duct. IMPRESSION: ERCP with findings of suspected choledocholithiasis with subsequent presumed biliary sweeping and sphincterotomy as above. These images were submitted for radiologic interpretation only. Please see the procedural report for the amount of contrast and the fluoroscopy time utilized. Electronically Signed   By: Sandi Mariscal M.D.   On: 11/24/2017 10:58     Assessment and Plan:   1. Atrial fibrillation: may be driven secondary to his sepsis/abdominal issues. Cannot anticoagulate given plans for ERCP, and he also had melenic stool today which is concerning. He was rate controlled at rest but noticeably had an increase  in heart rate with exertion. He was asymptomatic throughout. -Best option is rate control currently. Can start a low dose of oral metoprolol, though would monitor his blood pressure closely with this. Would start with 12.5 mg BID. Can also give 5 mg IV metoprolol if needed acutely.  -unsure of his EF, echo pending. As this is likely a reactive process, would make sure he is not dehydrated and give IV fluids as needed to keep HR low and blood pressure up -once he is able to be anticoagulated, we can discuss a rhythm control strategy if appropriate. -keep K>4 and Mg >2  We will follow with you.   For questions or updates, please contact [place] Please consult [email or web address] for contact info under Cardiology/STEMI.   Signed, Buford Dresser, MD  11/24/2017 6:30 PM

## 2017-11-24 NOTE — Progress Notes (Signed)
Paged by RN to reports patient's blood pressure is 93/62, heart rate 138, patient denies chest pain, no sob, will bolus ns one liter, further afib management per cardiology.

## 2017-11-25 ENCOUNTER — Encounter (HOSPITAL_COMMUNITY): Payer: Self-pay | Admitting: Gastroenterology

## 2017-11-25 ENCOUNTER — Inpatient Hospital Stay (HOSPITAL_COMMUNITY): Payer: Medicare Other

## 2017-11-25 DIAGNOSIS — I5030 Unspecified diastolic (congestive) heart failure: Secondary | ICD-10-CM

## 2017-11-25 DIAGNOSIS — A4151 Sepsis due to Escherichia coli [E. coli]: Principal | ICD-10-CM

## 2017-11-25 DIAGNOSIS — I4891 Unspecified atrial fibrillation: Secondary | ICD-10-CM

## 2017-11-25 LAB — COMPREHENSIVE METABOLIC PANEL
ALK PHOS: 85 U/L (ref 38–126)
ALT: 67 U/L — AB (ref 0–44)
AST: 28 U/L (ref 15–41)
Albumin: 2.5 g/dL — ABNORMAL LOW (ref 3.5–5.0)
Anion gap: 5 (ref 5–15)
BILIRUBIN TOTAL: 2.3 mg/dL — AB (ref 0.3–1.2)
BUN: 26 mg/dL — ABNORMAL HIGH (ref 8–23)
CO2: 24 mmol/L (ref 22–32)
CREATININE: 1.1 mg/dL (ref 0.61–1.24)
Calcium: 7.3 mg/dL — ABNORMAL LOW (ref 8.9–10.3)
Chloride: 107 mmol/L (ref 98–111)
Glucose, Bld: 298 mg/dL — ABNORMAL HIGH (ref 70–99)
Potassium: 4.4 mmol/L (ref 3.5–5.1)
Sodium: 136 mmol/L (ref 135–145)
Total Protein: 5.6 g/dL — ABNORMAL LOW (ref 6.5–8.1)

## 2017-11-25 LAB — CBC
HCT: 29.5 % — ABNORMAL LOW (ref 39.0–52.0)
Hemoglobin: 9.9 g/dL — ABNORMAL LOW (ref 13.0–17.0)
MCH: 29.1 pg (ref 26.0–34.0)
MCHC: 33.6 g/dL (ref 30.0–36.0)
MCV: 86.8 fL (ref 78.0–100.0)
Platelets: 145 10*3/uL — ABNORMAL LOW (ref 150–400)
RBC: 3.4 MIL/uL — ABNORMAL LOW (ref 4.22–5.81)
RDW: 14.6 % (ref 11.5–15.5)
WBC: 15 10*3/uL — AB (ref 4.0–10.5)

## 2017-11-25 LAB — HEPATITIS PANEL, ACUTE
HCV Ab: <0.1 s/co ratio (ref 0.0–0.9)
HEP B S AG: NEGATIVE
Hep A IgM: NEGATIVE
Hep B C IgM: NEGATIVE

## 2017-11-25 LAB — GLUCOSE, CAPILLARY
GLUCOSE-CAPILLARY: 296 mg/dL — AB (ref 70–99)
GLUCOSE-CAPILLARY: 322 mg/dL — AB (ref 70–99)
Glucose-Capillary: 249 mg/dL — ABNORMAL HIGH (ref 70–99)
Glucose-Capillary: 272 mg/dL — ABNORMAL HIGH (ref 70–99)
Glucose-Capillary: 284 mg/dL — ABNORMAL HIGH (ref 70–99)
Glucose-Capillary: 314 mg/dL — ABNORMAL HIGH (ref 70–99)

## 2017-11-25 LAB — TSH: TSH: 1.543 u[IU]/mL (ref 0.350–4.500)

## 2017-11-25 LAB — ECHOCARDIOGRAM COMPLETE
Height: 71 in
WEIGHTICAEL: 3051.17 [oz_av]

## 2017-11-25 MED ORDER — SODIUM CHLORIDE 0.9 % IV SOLN
3.0000 g | Freq: Four times a day (QID) | INTRAVENOUS | Status: DC
  Administered 2017-11-25 – 2017-11-28 (×12): 3 g via INTRAVENOUS
  Filled 2017-11-25 (×14): qty 3

## 2017-11-25 MED ORDER — INSULIN ASPART 100 UNIT/ML ~~LOC~~ SOLN
0.0000 [IU] | Freq: Three times a day (TID) | SUBCUTANEOUS | Status: DC
  Administered 2017-11-25: 7 [IU] via SUBCUTANEOUS
  Administered 2017-11-25 (×2): 5 [IU] via SUBCUTANEOUS
  Administered 2017-11-26: 2 [IU] via SUBCUTANEOUS
  Administered 2017-11-26: 3 [IU] via SUBCUTANEOUS
  Administered 2017-11-26 (×2): 2 [IU] via SUBCUTANEOUS
  Administered 2017-11-27: 3 [IU] via SUBCUTANEOUS
  Administered 2017-11-27: 2 [IU] via SUBCUTANEOUS
  Administered 2017-11-27: 5 [IU] via SUBCUTANEOUS
  Administered 2017-11-27: 2 [IU] via SUBCUTANEOUS
  Administered 2017-11-28: 5 [IU] via SUBCUTANEOUS
  Administered 2017-11-28: 3 [IU] via SUBCUTANEOUS

## 2017-11-25 MED ORDER — INSULIN ASPART 100 UNIT/ML ~~LOC~~ SOLN: 0.0000 [IU] | Freq: Three times a day (TID) | SUBCUTANEOUS | Status: DC

## 2017-11-25 NOTE — Progress Notes (Signed)
Progress Note  Patient Name: Perry Cameron Date of Encounter: 11/25/2017  Primary Cardiologist: New to [facility]; Dr. Cristal Deer (inpatient), follows with cardiologist in [place]  Subjective   Last evening had episode of afib RVR with associated hypotension. Managed medically, and at 22:24 converted back into normal sinus rhythm. States he has had this happen in the past around stress/perioperatively. Feels well this AM.   Inpatient Medications    Scheduled Meds: . insulin aspart  0-9 Units Subcutaneous TID AC & HS  . metoprolol tartrate  12.5 mg Oral BID   Continuous Infusions: . sodium chloride 10 mL/hr at 11/24/17 2040  . piperacillin-tazobactam (ZOSYN)  IV 3.375 g (11/25/17 0445)   PRN Meds: acetaminophen **OR** acetaminophen, morphine injection, ondansetron **OR** ondansetron (ZOFRAN) IV   Vital Signs    Vitals:   11/25/17 0440 11/25/17 0442 11/25/17 0807 11/25/17 0856  BP:    114/64  Pulse:    61  Resp:    18  Temp: 97.6 F (36.4 C)  97.6 F (36.4 C)   TempSrc: Oral  Oral   SpO2:    94%  Weight:  190 lb 11.2 oz (86.5 kg)    Height:        Intake/Output Summary (Last 24 hours) at 11/25/2017 1117 Last data filed at 11/25/2017 0500 Gross per 24 hour  Intake 232.58 ml  Output -  Net 232.58 ml   Filed Weights   11/24/17 0500 11/24/17 0732 11/25/17 0442  Weight: 186 lb 1.1 oz (84.4 kg) 186 lb (84.4 kg) 190 lb 11.2 oz (86.5 kg)    Telemetry    Atrial fibrillation with RVR noted overnight. Patient maintaining NSR at this time - Personally Reviewed  Physical Exam   GEN: No acute distress.   Neck: No JVD, no carotid bruits Cardiac: RRR, no murmurs, rubs, or gallops.  Respiratory: Clear to auscultation bilaterally, no wheezes/ rales/ rhonchi GI: NABS, Soft, nontender, non-distended  MS: No edema; No deformity. Neuro:  Nonfocal, moving all extremities spontaneously Psych: Normal affect   Labs    Chemistry Recent Labs  Lab 11/23/17 0505  11/24/17 0307 11/25/17 0433  NA 137 139 136  K 4.1 3.9 4.4  CL 107 109 107  CO2 19* 23 24  GLUCOSE 159* 113* 298*  BUN 21 19 26*  CREATININE 1.28* 1.08 1.10  CALCIUM 7.0* 6.9* 7.3*  PROT 5.3* 5.2* 5.6*  ALBUMIN 2.6* 2.3* 2.5*  AST 91* 58* 28  ALT 125* 92* 67*  ALKPHOS 106 94 85  BILITOT 5.9* 3.4* 2.3*  GFRNONAA 56* >60 >60  GFRAA >60 >60 >60  ANIONGAP 11 7 5      Hematology Recent Labs  Lab 11/23/17 0505 11/24/17 0307 11/24/17 1820 11/25/17 0433  WBC 14.1* 11.7*  --  15.0*  RBC 3.89* 3.61*  --  3.40*  HGB 11.5* 10.7* 10.7* 9.9*  HCT 34.2* 32.4* 32.3* 29.5*  MCV 87.9 89.8  --  86.8  MCH 29.6 29.6  --  29.1  MCHC 33.6 33.0  --  33.6  RDW 14.8 15.2  --  14.6  PLT 103* 120*  --  145*    Cardiac EnzymesNo results for input(s): TROPONINI in the last 168 hours. No results for input(s): TROPIPOC in the last 168 hours.   BNPNo results for input(s): BNP, PROBNP in the last 168 hours.   DDimer No results for input(s): DDIMER in the last 168 hours.   Radiology    Dg Ercp Biliary & Pancreatic Ducts  Result Date: 11/24/2017 CLINICAL DATA:  ERCP with sphincterotomy. EXAM: ERCP TECHNIQUE: Multiple spot images obtained with the fluoroscopic device and submitted for interpretation post-procedure. FLUOROSCOPY TIME:  3 minutes, 52 seconds COMPARISON:  None. FINDINGS: 6 spot intraoperative fluoroscopic images of the right upper abdominal quadrant during ERCP are provided for review. Initial image demonstrates an ERCP probe overlying the right upper abdominal quadrant. Subsequent images demonstrate opacification of the common bile duct which appears moderate to markedly dilated. There are at least a 5 filling defects within the common bile duct worrisome for nonocclusive choledocholithiasis Subsequent image demonstrates insufflation of a balloon within the central aspect of the CBD with subsequent presumed biliary sweeping and biliary sphincterotomy. There is minimal opacification of the  central aspect of the intrahepatic biliary tree which appears mildly dilated. There is faint opacification of the central aspect of the cystic duct. There is no definitive opacification of the pancreatic duct. IMPRESSION: ERCP with findings of suspected choledocholithiasis with subsequent presumed biliary sweeping and sphincterotomy as above. These images were submitted for radiologic interpretation only. Please see the procedural report for the amount of contrast and the fluoroscopy time utilized. Electronically Signed   By: Simonne ComeJohn  Watts M.D.   On: 11/24/2017 10:58    Cardiac Studies   Echo pending  Patient Profile     67 y.o. male with a hx of cholecystectomy, CAD, endoscopic repair of AAA on apixaban for clot who is being followed by cardiology for the evaluation of atrial fibrillation   Assessment & Plan    1. New onset atrial fibrillation: patient presented with sepsis/GI issues and was found to be in atrial fibrillation. Likely Afib being driving by acute infection. Patient was started on metoprolol 12.5mg  BID and converted to NSR overnight after an episode of RVR requiring digoxin. BP and HR improved this morning. He was on apixaban outpatient following repair of his AAA - this has been on hold this admission for ERCP and an episode of melena - Continue metoprolol 12.5mg  BID - Resume apixaban 5mg  BID for CHA2DS2-VASc Score of 4 (HTN, DM, Vasc, and age 68-74) when cleared by GI. Hemoglobin is downtrending, reports melena yesterday, so anticoagulation is secondary to current workup. - Echo pending to evaluate LV function - if normal, do not ancipitate further medication adjustments or cardiology work-up  2. Cholangitis: presented with abdominal pain. Found to have ecoli bacteremia likely 2/2 bile duct stone and cholangitis. He is on IV antibiotics and GI is following to assist with care. - Continue management per primary team/GI  [facility] will sign off at this time.  If there are any  additional concerns please page and let us know. Medication Recommendations:  Metoprolol 12.5 mg BID, apixaban when appropriate per GI Other recommendations (labs, testing, etc):  Per primary team Follow up as an outpatient:  [place] already has follow up scheduled.  For questions or updates, please contact [facility] Please consult [email or web address] for contact info under Cardiology/STEMI.   TIME SPENT WITH PATIENT: 25 minutes of direct patient care. More than 50% of that time was spent on coordination of care and counseling regarding atrial fibrillation and anticoagulation.  Jodelle RedBridgette Marlene Beidler, MD, PhD [facility]  [facility]

## 2017-11-25 NOTE — Progress Notes (Signed)
Subjective: No abdominal pain. Couple episodes blood in stool last night, none since.  Objective: Vital signs in last 24 hours: Temp:  [97.6 F (36.4 C)-98.1 F (36.7 C)] 97.6 F (36.4 C) (07/30 1132) Pulse Rate:  [60-142] 61 (07/30 0856) Resp:  [17-26] 18 (07/30 0856) BP: (87-118)/(56-71) 114/64 (07/30 0856) SpO2:  [93 %-97 %] 94 % (07/30 0856) Weight:  [86.5 kg (190 lb 11.2 oz)] 86.5 kg (190 lb 11.2 oz) (07/30 0442) Weight change: -0.031 kg (-1.1 oz) Last BM Date: 11/24/17  PE: GEN:  NAD HEENT:  Faint scleral icterus ABD:  Soft, non-tender  Lab Results: CBC    Component Value Date/Time   WBC 15.0 (H) 11/25/2017 0433   RBC 3.40 (L) 11/25/2017 0433   HGB 9.9 (L) 11/25/2017 0433   HCT 29.5 (L) 11/25/2017 0433   PLT 145 (L) 11/25/2017 0433   MCV 86.8 11/25/2017 0433   MCH 29.1 11/25/2017 0433   MCHC 33.6 11/25/2017 0433   RDW 14.6 11/25/2017 0433   LYMPHSABS 0.4 (L) 11/23/2017 0505   MONOABS 0.6 11/23/2017 0505   EOSABS 0.1 11/23/2017 0505   BASOSABS 0.1 11/23/2017 0505   CMP     Component Value Date/Time   NA 136 11/25/2017 0433   K 4.4 11/25/2017 0433   CL 107 11/25/2017 0433   CO2 24 11/25/2017 0433   GLUCOSE 298 (H) 11/25/2017 0433   BUN 26 (H) 11/25/2017 0433   CREATININE 1.10 11/25/2017 0433   CALCIUM 7.3 (L) 11/25/2017 0433   PROT 5.6 (L) 11/25/2017 0433   ALBUMIN 2.5 (L) 11/25/2017 0433   AST 28 11/25/2017 0433   ALT 67 (H) 11/25/2017 0433   ALKPHOS 85 11/25/2017 0433   BILITOT 2.3 (H) 11/25/2017 0433   GFRNONAA >60 11/25/2017 0433   GFRAA >60 11/25/2017 0433   Assessment:  1.  Choledocholithiasis with cholangitis.  ERCP yesterday with multiple CBD stones removed. 2.  Elevated LFTs, likely from #1 above, improving. 3.  Blood in stool with slight interval decrease in Hemoglobin.  Concern for post-sphincterotomy bleeding, but no blood in stool since last night. 4.  Atrial fibrillation, now rate- and BP-controlled. 5.  Chronic anticoagulation,  apixaban, which is currently on hold.  Plan:  1.  Continue antibiotics. 2.  Follow Hgb and watch for bleeding; if has ongoing blood in stool and drop hemoglobin over the next couple days, may have to consider EGD with side-viewing endoscope (hemospray might be helpful in this setting). 3.  Continue clear liquid diet for now, do not advance. 4.  [facility] will follow.   Perry JakschOUTLAW,Leinani Lisbon M 11/25/2017, 12:08 PM   Cell [phone] If no answer or after 5 PM call [phone]

## 2017-11-25 NOTE — Progress Notes (Signed)
PROGRESS NOTE  Perry Cameron ZOX:[id number] DOB: [birth year] DOA: 11/23/2017 PCP: System, Pcp Not In  Brief Summary:  he presented to outside hospital with n/v/ab pain and severe sepsis he had central line placed and received 5liter fluids bolus at outside hospital prior to transfer. Found to have cholangitis, E. coli bacteremia and A. Fib. Cardiology and [facility] following   HPI/Recap of past 24 hours:  He received digoxin last night,  he  converted to sinus rhythm this am  He has blood in stool after ERCP bp stable, denies chest pain or abdominal pain, no sob, no hypoxia, he is now on room air   Assessment/Plan: Principal Problem:   Sepsis (HCC) Active Problems:   Jaundice   Elevated LFTs   CAD (coronary artery disease)   AAA (abdominal aortic aneurysm) (HCC)   Essential hypertension   Type 2 diabetes mellitus with vascular disease (HCC)  Severe sepsis with ecoli bacteremia, source likely from cholangitis /cbd stone -H/o cholecystectomy  40yrs ago -he presented to outside hospital with n/v/ab pain and severe sepsis --he had central line placed and received 5liter fluids bolus at outside hospital prior to transfer. cxr here central line at distal SVC.  cxr "Mild cardiomegaly. Slight increased interstitial markings may be chronic or mild pulmonary edema" He received vanc initially, he has been on  zosyn, blood culture from outside hospital finalized today with pansensitive E. Coli, antibiotic change to Unasyn to cover anaerobes in the setting of cholangitis, plan to discharge on augmentin - ercp on 7/29 with CBD stone removal and sphincterectomy   Blood in stool after ERCP/anemia -monitor hgb, gi following   Thrombocytopenia Likely from acute illness, improving   Acute hypoxia, o2 sat on presentation was 84%, he is on 2liter o2, denies sob, no edema, lung exam clear -cxr "Mild cardiomegaly. Slight increased interstitial markings may be chronic or mild pulmonary  edema" -monitor volume status -now on room air  Elevated Cr , likely AKI, No baseline, UA no bacteria Bun/cr 21/1.28 on 7/28,  Fluctuating, but overall improving  noninsulin dependent dm2 a1c 8 Home meds metformin held, on ssi here  CAD s/p CABG in 2000 Denies chest pain cxr "Mild cardiomegaly. Slight increased interstitial markings may be chronic or mild pulmonary edema" Close monitor volume status  Afib, new diagnosis -denies prior h/o of afib -Keep on tele, resume lopressor at decreased dose, titrate up if bp allows -keep K.4, mag>2 -anticoagulation need to be held for 5 days post ERCp/sphinctorectomy per GI - echocardiogram  - cardiology consulted "Medication Recommendations:  Metoprolol 12.5 mg BID, apixaban when appropriate per GI"    H/o abdominal aortic aneurysm status post endovascular repair (05/2017)complicated with embolization to left femoral artery on apixaban which patient has not taken for last 2 days due to persistent vomiting.  In anticipation of procedure we are holding of apixaban.  CT scan does not show any concerning features for the abdominal aortic aneurysm.    Code Status: full   Family Communication: patient   Disposition Plan: not ready to discharge   Consultants:  [facility]  cardiology  Procedures:  Cental line placement on 7/27  Ercp  on 7/29  Antibiotics:  vanc from admission to 7/28  Zosyn from admission to 7/30  unasyn from 7/30   Objective: BP (!) 107/56   Pulse 67   Temp 97.6 F (36.4 C) (Oral)   Resp (!) 22   Ht 5\' 11"  (1.803 m)   Wt 86.5 kg (190 lb  11.2 oz)   SpO2 92%   BMI 26.60 kg/m   Intake/Output Summary (Last 24 hours) at 11/25/2017 1820 Last data filed at 11/25/2017 1530 Gross per 24 hour  Intake 1031.95 ml  Output -  Net 1031.95 ml   Filed Weights   11/24/17 0500 11/24/17 0732 11/25/17 0442  Weight: 84.4 kg (186 lb 1.1 oz) 84.4 kg (186 lb) 86.5 kg (190 lb 11.2 oz)    Exam: Patient is examined  daily including today on 11/25/2017, exams remain the same as of yesterday except that has changed    General:  NAD  Cardiovascular: RRR  Respiratory: CTABL  Abdomen: Soft/ND/NT, positive BS  Musculoskeletal: No Edema  Neuro: alert, oriented   Data Reviewed: Basic Metabolic Panel: Recent Labs  Lab 11/23/17 0505 11/24/17 0307 11/25/17 0433  NA 137 139 136  K 4.1 3.9 4.4  CL 107 109 107  CO2 19* 23 24  GLUCOSE 159* 113* 298*  BUN 21 19 26*  CREATININE 1.28* 1.08 1.10  CALCIUM 7.0* 6.9* 7.3*   Liver Function Tests: Recent Labs  Lab 11/23/17 0505 11/24/17 0307 11/25/17 0433  AST 91* 58* 28  ALT 125* 92* 67*  ALKPHOS 106 94 85  BILITOT 5.9* 3.4* 2.3*  PROT 5.3* 5.2* 5.6*  ALBUMIN 2.6* 2.3* 2.5*   Recent Labs  Lab 11/24/17 0307  LIPASE 62*   No results for input(s): AMMONIA in the last 168 hours. CBC: Recent Labs  Lab 11/23/17 0505 11/24/17 0307 11/24/17 1820 11/25/17 0433  WBC 14.1* 11.7*  --  15.0*  NEUTROABS 12.9*  --   --   --   HGB 11.5* 10.7* 10.7* 9.9*  HCT 34.2* 32.4* 32.3* 29.5*  MCV 87.9 89.8  --  86.8  PLT 103* 120*  --  145*   Cardiac Enzymes:   No results for input(s): CKTOTAL, CKMB, CKMBINDEX, TROPONINI in the last 168 hours. BNP (last 3 results) No results for input(s): BNP in the last 8760 hours.  ProBNP (last 3 results) No results for input(s): PROBNP in the last 8760 hours.  CBG: Recent Labs  Lab 11/25/17 0006 11/25/17 0415 11/25/17 0806 11/25/17 1131 11/25/17 1652  GLUCAP 314* 272* 249* 296* 284*    Recent Results (from the past 240 hour(s))  Culture, blood (x 2)     Status: None (Preliminary result)   Collection Time: 11/23/17  4:54 AM  Result Value Ref Range Status   Specimen Description BLOOD LEFT ARM  Final   Special Requests   Final    BOTTLES DRAWN AEROBIC ONLY Blood Culture results may not be optimal due to an inadequate volume of blood received in culture bottles   Culture   Final    NO GROWTH 2  DAYS Performed at [facility], [address], [place], [place] [address]    Report Status PENDING  Incomplete  Culture, blood (x 2)     Status: None (Preliminary result)   Collection Time: 11/23/17  5:15 AM  Result Value Ref Range Status   Specimen Description BLOOD LEFT ARM  Final   Special Requests   Final    BOTTLES DRAWN AEROBIC ONLY Blood Culture results may not be optimal due to an inadequate volume of blood received in culture bottles   Culture   Final    NO GROWTH 2 DAYS Performed at [facility], [address], [place], [place] [address]    Report Status PENDING  Incomplete  MRSA PCR Screening  Status: None   Collection Time: 11/23/17  6:54 AM  Result Value Ref Range Status   MRSA by PCR NEGATIVE NEGATIVE Final    Comment:        The GeneXpert MRSA Assay (FDA approved for NASAL specimens only), is one component of a comprehensive MRSA colonization surveillance program. It is not intended to diagnose MRSA infection nor to guide or monitor treatment for MRSA infections. Performed at [facility], [address] [place], [place] [address]      Studies: No results found.  Scheduled Meds: . insulin aspart  0-9 Units Subcutaneous TID AC & HS  . metoprolol tartrate  12.5 mg Oral BID    Continuous Infusions: . sodium chloride 10 mL/hr at 11/24/17 2040  . ampicillin-sulbactam (UNASYN) IV 3 g (11/25/17 1805)     Time spent: 35mins,  I have personally reviewed and interpreted on  11/25/2017 daily labs, tele strips, imagings as discussed above under date review session and assessment and plans.  I reviewed all nursing notes, pharmacy notes, consultant notes,  vitals, pertinent old records  I have discussed plan of care as described above with RN , patient  on 11/25/2017   Albertine GratesFang Kieran Arreguin MD, PhD  [facility] Pager [phone]. If 7PM-7AM, please contact night-coverage at [email or web address], password [email or web address] 11/25/2017, 6:20 PM  LOS: 2 days

## 2017-11-25 NOTE — Progress Notes (Signed)
  Echocardiogram 2D Echocardiogram has been performed.  Celene SkeenVijay  Kayanna Mckillop 11/25/2017, 3:16 PM

## 2017-11-25 NOTE — Progress Notes (Signed)
Inpatient Diabetes Program Recommendations  AACE/ADA: New Consensus Statement on Inpatient Glycemic Control (2015)  Target Ranges:  Prepandial:   less than 140 mg/dL      Peak postprandial:   less than 180 mg/dL (1-2 hours)      Critically ill patients:  140 - 180 mg/dL   Results for Perry Cameron, Perry Cameron (MRN [id number]) as of 11/25/2017 09:47  Ref. Range 11/24/2017 09:45 11/24/2017 12:13 11/24/2017 17:09 11/24/2017 20:09 11/25/2017 00:06 11/25/2017 04:15 11/25/2017 08:06  Glucose-Capillary Latest Ref Range: 70 - 99 mg/dL 132104 (H) 440142 (H) 102318 (H) 331 (H) 314 (H) 272 (H) 249 (H)   Review of Glycemic Control  Diabetes history: DM 2 Outpatient Diabetes medications: Metformin 1000 mg BID, Januvia 100 mg Daily Current orders for Inpatient glycemic control: Novolog 0-9 units Q4 hours  Inpatient Diabetes Program Recommendations:    Glucose trends elevated in the 200-300 range. Patient received Decadron 10 mg yesterday am. Please consider increasing Novolog Correction to 0-15 units tid + HS coverage since patient has diet ordered.  Thanks,  Christena DeemShannon Ellanore Vanhook RN, MSN, BC-ADM Inpatient Diabetes Coordinator Team Pager [phone] (8a-5p)

## 2017-11-25 NOTE — Progress Notes (Signed)
Call received from Mary[facility]Patricia Bleins [facility] to make aware patients blood cultures are positive for [facility] and sensitivities will be faxed. Provider made aware via text page.

## 2017-11-25 NOTE — Progress Notes (Signed)
Pharmacy Antibiotic Note  Perry DrapeRicky Lee Chunn is a 67 y.o. male admitted on 11/23/2017 with sepsis.  Currently on Zosyn for sepsis with E.coli bacteremia ,  Received final result of 7/27 Blood culture from [facility]:  E. Coli pan sensitive. Discussed results with Dr. Roda ShuttersXu. Dr. Roda ShuttersXu wants to change abx to  Unasyn for anaerobic coverage due to source likely from cholangitis /cbd stonel & likely change to augmentin for discharge SCr 1.10 CrCl ~ 68 ml/min  Plan: Discontinue Zosyn Start Unasyn 3 g IV q6h F/u renal function, clinical status daily  Height: 5\' 11"  (180.3 cm) Weight: 190 lb 11.2 oz (86.5 kg) IBW/kg (Calculated) : 75.3  Temp (24hrs), Avg:97.7 F (36.5 C), Min:97.6 F (36.4 C), Max:98.1 F (36.7 C)  No Known Allergies   Thank you for allowing pharmacy to be a part of this patient's care.  Noah Delaineuth Zayli Villafuerte, RPh Clinical Pharmacist Pager: [phone] Please check AMION for all [facility] phone numbers After 10:00 PM, call Main Pharmacy [phone] 11/25/2017 4:44 PM

## 2017-11-26 DIAGNOSIS — R945 Abnormal results of liver function studies: Secondary | ICD-10-CM

## 2017-11-26 DIAGNOSIS — I10 Essential (primary) hypertension: Secondary | ICD-10-CM

## 2017-11-26 DIAGNOSIS — R17 Unspecified jaundice: Secondary | ICD-10-CM

## 2017-11-26 DIAGNOSIS — E1159 Type 2 diabetes mellitus with other circulatory complications: Secondary | ICD-10-CM

## 2017-11-26 LAB — CBC
HEMATOCRIT: 25.4 % — AB (ref 39.0–52.0)
HEMOGLOBIN: 8.6 g/dL — AB (ref 13.0–17.0)
MCH: 29.5 pg (ref 26.0–34.0)
MCHC: 33.9 g/dL (ref 30.0–36.0)
MCV: 87 fL (ref 78.0–100.0)
Platelets: 131 10*3/uL — ABNORMAL LOW (ref 150–400)
RBC: 2.92 MIL/uL — AB (ref 4.22–5.81)
RDW: 15 % (ref 11.5–15.5)
WBC: 8.6 10*3/uL (ref 4.0–10.5)

## 2017-11-26 LAB — COMPREHENSIVE METABOLIC PANEL
ALBUMIN: 2.4 g/dL — AB (ref 3.5–5.0)
ALT: 52 U/L — AB (ref 0–44)
AST: 24 U/L (ref 15–41)
Alkaline Phosphatase: 71 U/L (ref 38–126)
Anion gap: 4 — ABNORMAL LOW (ref 5–15)
BILIRUBIN TOTAL: 2.2 mg/dL — AB (ref 0.3–1.2)
BUN: 14 mg/dL (ref 8–23)
CO2: 27 mmol/L (ref 22–32)
Calcium: 7.4 mg/dL — ABNORMAL LOW (ref 8.9–10.3)
Chloride: 107 mmol/L (ref 98–111)
Creatinine, Ser: 0.91 mg/dL (ref 0.61–1.24)
GFR calc non Af Amer: >60 mL/min (ref >60)
Glucose, Bld: 183 mg/dL — ABNORMAL HIGH (ref 70–99)
Potassium: 3.6 mmol/L (ref 3.5–5.1)
SODIUM: 138 mmol/L (ref 135–145)
TOTAL PROTEIN: 5.3 g/dL — AB (ref 6.5–8.1)

## 2017-11-26 LAB — GLUCOSE, CAPILLARY
Glucose-Capillary: 159 mg/dL — ABNORMAL HIGH (ref 70–99)
Glucose-Capillary: 171 mg/dL — ABNORMAL HIGH (ref 70–99)
Glucose-Capillary: 198 mg/dL — ABNORMAL HIGH (ref 70–99)
Glucose-Capillary: 223 mg/dL — ABNORMAL HIGH (ref 70–99)

## 2017-11-26 LAB — PROTIME-INR
INR: 1.22
Prothrombin Time: 15.3 seconds — ABNORMAL HIGH (ref 11.4–15.2)

## 2017-11-26 MED ORDER — POTASSIUM CHLORIDE CRYS ER 20 MEQ PO TBCR: 40.0000 meq | EXTENDED_RELEASE_TABLET | Freq: Two times a day (BID) | ORAL | Status: DC

## 2017-11-26 MED ORDER — POTASSIUM CHLORIDE CRYS ER 20 MEQ PO TBCR
40.0000 meq | EXTENDED_RELEASE_TABLET | Freq: Once | ORAL | Status: AC
  Administered 2017-11-26: 40 meq via ORAL
  Filled 2017-11-26: qty 2

## 2017-11-26 NOTE — Progress Notes (Signed)
PROGRESS NOTE    Perry Cameron  ZOX:[id number] DOB: 03-08-1951 DOA: 11/23/2017 PCP: System, Pcp Not In    Assessment & Plan:   Principal Problem:   Sepsis (HCC) Active Problems:   Jaundice   Elevated LFTs   CAD (coronary artery disease)   AAA (abdominal aortic aneurysm) (HCC)   Essential hypertension   Type 2 diabetes mellitus with vascular disease (HCC)  Severe sepsis with ecoli bacteremia, source likely from cholangitis /cbd stone -H/o cholecystectomy  73yrs ago -he presented to outside hospital with n/v/ab pain and severe sepsis --he had central line placed and received 5liter fluids bolus at outside hospital prior to transfer. cxr here central line at distal SVC.  cxr "Mild cardiomegaly. Slight increased interstitial markings may be chronic or mild pulmonary edema" He received vanc initially, he has been on  zosyn, blood culture from outside hospital finalized today with pansensitive E. Coli, antibiotic change to Unasyn to cover anaerobes in the setting of cholangitis with plan to transition to PO augmentin at time of discharge - ercp on 7/29 with CBD stone removal and sphincterectomy   Blood in stool after ERCP/anemia -patient reports gradually improving blood per rectum -Will repeat CBC in AM -Per GI, possible endoscopy 8/1 if patient still bleeding  Thrombocytopenia Suspect related to acute infection Improving Repeat CBC in AM  Acute hypoxia, o2 sat on presentation was 84%, he is on 2liter o2, denies sob, no edema, lung exam clear -cxr "Mild cardiomegaly. Slight increased interstitial markings may be chronic or mild pulmonary edema" -Patient had been weaned to room air  Elevated Cr , likely AKI, No baseline, Cr improving, labs reviewed  noninsulin dependent dm2 a1c 8 Home metformin had been held, continue on ssi here  CAD s/p CABG in 2000 Denies chest pain cxr "Mild cardiomegaly. Slight increased interstitial markings may be chronic or mild pulmonary  edema" Stable at this time  Afib, new diagnosis -denies prior h/o of afib -Keep on tele, resume lopressor at decreased dose, titrate up if bp allows -keep K.4, mag>2 -anticoagulation need to be held for 5 days post ERCp/sphinctorectomy per GI - echocardiogram  - Cardiology was consulted "Medication Recommendations:Metoprolol 12.5 mg BID, apixaban when appropriate per GI" -anticoagulant remains on hold  H/o abdominal aortic aneurysm status post endovascular repair (05/2017)complicated with embolization to left femoral artery on apixaban which patient has not taken for last 2 days due to persistent vomiting. Apixaban remains on hold as per above.CT scan does not show any concerning features for the abdominal aortic aneurysm.  DVT prophylaxis: SCD's Code Status: Full Family Communication: Pt in room, family not at bedside Disposition Plan: Uncertain at this time  Consultants:   GI  Cardiology  Procedures:   Cental line placement on 7/27  Ercp  on 7/29  Antimicrobials: Anti-infectives (From admission, onward)   Start     Dose/Rate Route Frequency Ordered Stop   11/25/17 1730  Ampicillin-Sulbactam (UNASYN) 3 g in sodium chloride 0.9 % 100 mL IVPB     3 g 200 mL/hr over 30 Minutes Intravenous Every 6 hours 11/25/17 1643     11/23/17 1200  vancomycin (VANCOCIN) IVPB 750 mg/150 ml premix  Status:  Discontinued     750 mg 150 mL/hr over 60 Minutes Intravenous Every 12 hours 11/23/17 0442 11/23/17 1245   11/23/17 0400  piperacillin-tazobactam (ZOSYN) IVPB 3.375 g  Status:  Discontinued     3.375 g 100 mL/hr over 30 Minutes Intravenous  Once 11/23/17 0345 11/23/17 0353  11/23/17 0400  vancomycin (VANCOCIN) IVPB 1000 mg/200 mL premix  Status:  Discontinued     1,000 mg 200 mL/hr over 60 Minutes Intravenous  Once 11/23/17 0345 11/23/17 0353   11/23/17 0400  piperacillin-tazobactam (ZOSYN) IVPB 3.375 g  Status:  Discontinued     3.375 g 12.5 mL/hr over 240 Minutes Intravenous  Every 8 hours 11/23/17 0354 11/25/17 1637       Subjective: Reports some bloody stools this AM that seems to be gradually improving  Objective: Vitals:   11/26/17 0801 11/26/17 0842 11/26/17 1001 11/26/17 1200  BP: 108/88 (!) 131/98 132/73 132/65  Pulse: 88 66 62 62  Resp: (!) 27 17 (!) 25 10  Temp:    98.2 F (36.8 C)  TempSrc:    Oral  SpO2: 95% 98% 94% 95%  Weight:      Height:        Intake/Output Summary (Last 24 hours) at 11/26/2017 1530 Last data filed at 11/26/2017 1501 Gross per 24 hour  Intake 1111.05 ml  Output 400 ml  Net 711.05 ml   Filed Weights   11/24/17 0732 11/25/17 0442 11/26/17 0513  Weight: 84.4 kg (186 lb) 86.5 kg (190 lb 11.2 oz) 86.4 kg (190 lb 7.6 oz)    Examination:  General exam: Appears calm and comfortable  Respiratory system: Clear to auscultation. Respiratory effort normal. Cardiovascular system: S1 & S2 heard, RRR Gastrointestinal system: Abdomen is nondistended, soft and nontender. No organomegaly or masses felt. Normal bowel sounds heard. Central nervous system: Alert and oriented. No focal neurological deficits. Extremities: Symmetric 5 x 5 power. Skin: No rashes, lesions Psychiatry: Judgement and insight appear normal. Mood & affect appropriate.   Data Reviewed: I have personally reviewed following labs and imaging studies  CBC: Recent Labs  Lab 11/23/17 0505 11/24/17 0307 11/24/17 1820 11/25/17 0433 11/26/17 0414  WBC 14.1* 11.7*  --  15.0* 8.6  NEUTROABS 12.9*  --   --   --   --   HGB 11.5* 10.7* 10.7* 9.9* 8.6*  HCT 34.2* 32.4* 32.3* 29.5* 25.4*  MCV 87.9 89.8  --  86.8 87.0  PLT 103* 120*  --  145* 131*   Basic Metabolic Panel: Recent Labs  Lab 11/23/17 0505 11/24/17 0307 11/25/17 0433 11/26/17 0414  NA 137 139 136 138  K 4.1 3.9 4.4 3.6  CL 107 109 107 107  CO2 19* 23 24 27   GLUCOSE 159* 113* 298* 183*  BUN 21 19 26* 14  CREATININE 1.28* 1.08 1.10 0.91  CALCIUM 7.0* 6.9* 7.3* 7.4*   GFR: Estimated  Creatinine Clearance: 82.7 mL/min (by C-G formula based on SCr of 0.91 mg/dL). Liver Function Tests: Recent Labs  Lab 11/23/17 0505 11/24/17 0307 11/25/17 0433 11/26/17 0414  AST 91* 58* 28 24  ALT 125* 92* 67* 52*  ALKPHOS 106 94 85 71  BILITOT 5.9* 3.4* 2.3* 2.2*  PROT 5.3* 5.2* 5.6* 5.3*  ALBUMIN 2.6* 2.3* 2.5* 2.4*   Recent Labs  Lab 11/24/17 0307  LIPASE 62*   No results for input(s): AMMONIA in the last 168 hours. Coagulation Profile: Recent Labs  Lab 11/23/17 0505 11/26/17 0414  INR 1.92 1.22   Cardiac Enzymes: No results for input(s): CKTOTAL, CKMB, CKMBINDEX, TROPONINI in the last 168 hours. BNP (last 3 results) No results for input(s): PROBNP in the last 8760 hours. HbA1C: Recent Labs    11/24/17 0307  HGBA1C 8.0*   CBG: Recent Labs  Lab 11/25/17 1131 11/25/17 1652 11/25/17 1950  11/26/17 0757 11/26/17 1202  GLUCAP 296* 284* 322* 159* 171*   Lipid Profile: No results for input(s): CHOL, HDL, LDLCALC, TRIG, CHOLHDL, LDLDIRECT in the last 72 hours. Thyroid Function Tests: Recent Labs    11/25/17 0433  TSH 1.543   Anemia Panel: No results for input(s): VITAMINB12, FOLATE, FERRITIN, TIBC, IRON, RETICCTPCT in the last 72 hours. Sepsis Labs: Recent Labs  Lab 11/23/17 0505 11/23/17 0713 11/24/17 0307  PROCALCITON 3.73  --   --   LATICACIDVEN 1.9 1.6 1.1    Recent Results (from the past 240 hour(s))  Culture, blood (x 2)     Status: None (Preliminary result)   Collection Time: 11/23/17  4:54 AM  Result Value Ref Range Status   Specimen Description BLOOD LEFT ARM  Final   Special Requests   Final    BOTTLES DRAWN AEROBIC ONLY Blood Culture results may not be optimal due to an inadequate volume of blood received in culture bottles   Culture   Final    NO GROWTH 3 DAYS Performed at [facility], [address] [place], [place] [address]    Report Status PENDING  Incomplete  Culture, blood (x 2)     Status: None (Preliminary result)     Collection Time: 11/23/17  5:15 AM  Result Value Ref Range Status   Specimen Description BLOOD LEFT ARM  Final   Special Requests   Final    BOTTLES DRAWN AEROBIC ONLY Blood Culture results may not be optimal due to an inadequate volume of blood received in culture bottles   Culture   Final    NO GROWTH 3 DAYS Performed at [facility], [address], [place], [place] [address]    Report Status PENDING  Incomplete  MRSA PCR Screening     Status: None   Collection Time: 11/23/17  6:54 AM  Result Value Ref Range Status   MRSA by PCR NEGATIVE NEGATIVE Final    Comment:        The GeneXpert MRSA Assay (FDA approved for NASAL specimens only), is one component of a comprehensive MRSA colonization surveillance program. It is not intended to diagnose MRSA infection nor to guide or monitor treatment for MRSA infections. Performed at [facility], [address] [place], [place] [address]      Radiology Studies: No results found.  Scheduled Meds: . insulin aspart  0-9 Units Subcutaneous TID AC & HS  . metoprolol tartrate  12.5 mg Oral BID  . potassium chloride  40 mEq Oral Once   Continuous Infusions: . sodium chloride 10 mL/hr at 11/26/17 1228  . ampicillin-sulbactam (UNASYN) IV 3 g (11/26/17 1229)     LOS: 3 days   Rickey Barbara, MD [facility] Pager [phone]  If 7PM-7AM, please contact night-coverage [email or web address] Password [email or web address] 11/26/2017, 3:30 PM

## 2017-11-26 NOTE — Care Management Important Message (Signed)
Important Message  Patient Details  Name: Carla DrapeRicky Lee Tenbrink MRN: [id number] Date of Birth: 05-04-1950   Medicare Important Message Given:  Yes    Miller Limehouse Stefan ChurchBratton 11/26/2017, 4:09 PM

## 2017-11-26 NOTE — Progress Notes (Signed)
Subjective: Some bloody stools yesterday, but overall appear less in volume and degree of blood. No abdominal pain.  Objective: Vital signs in last 24 hours: Temp:  [97.6 F (36.4 C)-98.1 F (36.7 C)] 97.7 F (36.5 C) (07/31 0718) Pulse Rate:  [63-79] 66 (07/31 0842) Resp:  [13-27] 17 (07/31 0842) BP: (98-139)/(47-98) 131/98 (07/31 0842) SpO2:  [92 %-98 %] 98 % (07/31 0842) Weight:  [86.4 kg (190 lb 7.6 oz)] 86.4 kg (190 lb 7.6 oz) (07/31 0513) Weight change: 2.031 kg (4 lb 7.6 oz) Last BM Date: 11/26/17  PE: GEN:  NAD ABD:  Soft, non-tender  Lab Results: CBC    Component Value Date/Time   WBC 8.6 11/26/2017 0414   RBC 2.92 (L) 11/26/2017 0414   HGB 8.6 (L) 11/26/2017 0414   HCT 25.4 (L) 11/26/2017 0414   PLT 131 (L) 11/26/2017 0414   MCV 87.0 11/26/2017 0414   MCH 29.5 11/26/2017 0414   MCHC 33.9 11/26/2017 0414   RDW 15.0 11/26/2017 0414   LYMPHSABS 0.4 (L) 11/23/2017 0505   MONOABS 0.6 11/23/2017 0505   EOSABS 0.1 11/23/2017 0505   BASOSABS 0.1 11/23/2017 0505   CMP     Component Value Date/Time   NA 138 11/26/2017 0414   K 3.6 11/26/2017 0414   CL 107 11/26/2017 0414   CO2 27 11/26/2017 0414   GLUCOSE 183 (H) 11/26/2017 0414   BUN 14 11/26/2017 0414   CREATININE 0.91 11/26/2017 0414   CALCIUM 7.4 (L) 11/26/2017 0414   PROT 5.3 (L) 11/26/2017 0414   ALBUMIN 2.4 (L) 11/26/2017 0414   AST 24 11/26/2017 0414   ALT 52 (H) 11/26/2017 0414   ALKPHOS 71 11/26/2017 0414   BILITOT 2.2 (H) 11/26/2017 0414   GFRNONAA >60 11/26/2017 0414   GFRAA >60 11/26/2017 0414   Assessment:  1.  Choledocholithiasis with cholangitis.  ERCP two days ago with multiple CBD stones removed. 2.  Elevated LFTs, likely from #1 above, improving. 3.  Blood in stool with interval decrease in Hemoglobin.  Concern for post-sphincterotomy bleeding, but degree of blood in stool appears to be lessening. 4.  Atrial fibrillation, now rate- and BP-controlled. 5.  Chronic anticoagulation,  apixaban, which is currently on hold.  Plan:  1.  Clear liquid diet only. 2.  Follow clinical course re: bleeding and serial CBCs. 3.  It appears that patient's bleeding is improving, and would thus follow him expectantly today; however, if there is concern over further bleeding over the next 24 hours, a spot for endoscopy tomorrow at 1pm has been reserved if necessary. 4.  [facility] will follow.   Freddy JakschOUTLAW,Alice Vitelli M 11/26/2017, 9:48 AM   Cell [phone] If no answer or after 5 PM call [phone]

## 2017-11-27 ENCOUNTER — Encounter (HOSPITAL_COMMUNITY)
Admission: AD | Disposition: A | Discharge status: Home / Self Care | Payer: Self-pay | Source: Other Acute Inpatient Hospital | Attending: Internal Medicine

## 2017-11-27 LAB — BASIC METABOLIC PANEL
Anion gap: 6 (ref 5–15)
BUN: 7 mg/dL — AB (ref 8–23)
CALCIUM: 7.6 mg/dL — AB (ref 8.9–10.3)
CO2: 28 mmol/L (ref 22–32)
CREATININE: 0.84 mg/dL (ref 0.61–1.24)
Chloride: 108 mmol/L (ref 98–111)
GFR calc Af Amer: >60 mL/min (ref >60)
GFR calc non Af Amer: >60 mL/min (ref >60)
Glucose, Bld: 168 mg/dL — ABNORMAL HIGH (ref 70–99)
Potassium: 3.7 mmol/L (ref 3.5–5.1)
SODIUM: 142 mmol/L (ref 135–145)

## 2017-11-27 LAB — CBC
HCT: 25.7 % — ABNORMAL LOW (ref 39.0–52.0)
Hemoglobin: 8.5 g/dL — ABNORMAL LOW (ref 13.0–17.0)
MCH: 29.8 pg (ref 26.0–34.0)
MCHC: 33.1 g/dL (ref 30.0–36.0)
MCV: 90.2 fL (ref 78.0–100.0)
PLATELETS: 141 10*3/uL — AB (ref 150–400)
RBC: 2.85 MIL/uL — ABNORMAL LOW (ref 4.22–5.81)
RDW: 15 % (ref 11.5–15.5)
WBC: 4.7 10*3/uL (ref 4.0–10.5)

## 2017-11-27 LAB — GLUCOSE, CAPILLARY
GLUCOSE-CAPILLARY: 151 mg/dL — AB (ref 70–99)
GLUCOSE-CAPILLARY: 170 mg/dL — AB (ref 70–99)
Glucose-Capillary: 193 mg/dL — ABNORMAL HIGH (ref 70–99)
Glucose-Capillary: 255 mg/dL — ABNORMAL HIGH (ref 70–99)
Glucose-Capillary: 224 mg/dL — ABNORMAL HIGH (ref 70–99)

## 2017-11-27 LAB — MAGNESIUM: MAGNESIUM: 1.7 mg/dL (ref 1.7–2.4)

## 2017-11-27 SURGERY — ENDOSCOPIC RETROGRADE CHOLANGIOPANCREATOGRAPHY (ERCP) WITH PROPOFOL
Anesthesia: General

## 2017-11-27 MED ORDER — POTASSIUM CHLORIDE CRYS ER 20 MEQ PO TBCR
40.0000 meq | EXTENDED_RELEASE_TABLET | Freq: Once | ORAL | Status: AC
Start: 2017-11-27 — End: 2017-11-27
  Administered 2017-11-27: 40 meq via ORAL
  Filled 2017-11-27: qty 2

## 2017-11-27 MED ORDER — MAGNESIUM SULFATE 2 GM/50ML IV SOLN
2.0000 g | Freq: Once | INTRAVENOUS | Status: AC
  Administered 2017-11-27: 2 g via INTRAVENOUS
  Filled 2017-11-27: qty 50

## 2017-11-27 MED ORDER — DIGOXIN 0.25 MG/ML IJ SOLN
0.1250 mg | Freq: Every day | INTRAMUSCULAR | Status: DC
  Administered 2017-11-27 – 2017-11-28 (×2): 0.125 mg via INTRAVENOUS
  Filled 2017-11-27 (×2): qty 2

## 2017-11-27 NOTE — Progress Notes (Signed)
No abdominal pain.  Less blood in stool, now more dark old blood.  Hgb stable.  We've decided to hold off on endoscopy; can advance to soft diet; stay off apixaban for another 3 days, hopefully home tomorrow.  Case discussed with Dr. Rhona Leavenshiu.

## 2017-11-27 NOTE — Progress Notes (Signed)
PROGRESS NOTE    Tel Hevia  WUJ:[id number] DOB: 1951-03-06 DOA: 11/23/2017 PCP: System, Pcp Not In    Assessment & Plan:   Principal Problem:   Sepsis (HCC) Active Problems:   Jaundice   Elevated LFTs   CAD (coronary artery disease)   AAA (abdominal aortic aneurysm) (HCC)   Essential hypertension   Type 2 diabetes mellitus with vascular disease (HCC)  Severe sepsis with ecoli bacteremia, source likely from cholangitis /cbd stone -H/o cholecystectomy  56yrs ago -he presented to outside hospital with n/v/ab pain and severe sepsis --he had central line placed and received 5liter fluids bolus at outside hospital prior to transfer. cxr here central line at distal SVC.  cxr "Mild cardiomegaly. Slight increased interstitial markings may be chronic or mild pulmonary edema" He received vanc initially, he has been on  zosyn, blood culture from outside hospital finalized today with pansensitive E. Coli, antibiotic change to Unasyn to cover anaerobes in the setting of cholangitis with plan to transition to PO augmentin at time of discharge - ercp on 7/29 with CBD stone removal and sphincterectomy  -GI continues to follow. Discussed with GI  Blood in stool after ERCP/anemia -patient reports gradually improving blood per rectum -Hgb remains stable. No plan for endoscopy today -Repeat CBC in AM  Thrombocytopenia Suspect related to acute infection Improving Repeat CBC in AM  Acute hypoxia, o2 sat on presentation was 84%, he is on 2liter o2, denies sob, no edema, lung exam clear -cxr "Mild cardiomegaly. Slight increased interstitial markings may be chronic or mild pulmonary edema" -Patient had been weaned to room air  Elevated Cr , likely AKI, No baseline, Cr improving, labs reviewed  noninsulin dependent dm2 a1c 8 Home metformin had been held, continue on ssi while in hospital  CAD s/p CABG in 2000 Denies chest pain cxr "Mild cardiomegaly. Slight increased interstitial  markings may be chronic or mild pulmonary edema" Stable at this time. No chest pain this AM  Afib, new diagnosis -denies prior h/o of afib -Keep on tele, resume lopressor at decreased dose, titrate up if bp allows -keep K.4, mag>2 -anticoagulation need to be held for 5 days post ERCp/sphinctorectomy per GI - echocardiogram  - Cardiology was consulted "Medication Recommendations:Metoprolol 12.5 mg BID, apixaban when appropriate per GI" -anticoagulant remains on hold. Per GI, continue to hold x another 3 days - In Afib RVR this AM. Have ordered dignoxin IV. Will conitnue daily regimen  Hypomagnesemia, Hypokalemia -K replaced -Mg replaced -Labs reviewed  H/o abdominal aortic aneurysm status post endovascular repair (05/2017)complicated with embolization to left femoral artery on apixaban which patient has not taken for last 2 days due to persistent vomiting. Apixaban remains on hold as per above.CT scan does not show any concerning features for the abdominal aortic aneurysm.  DVT prophylaxis: SCD's Code Status: Full Family Communication: Pt in room, family not at bedside Disposition Plan: Uncertain at this time  Consultants:   GI  Cardiology  Procedures:   Cental line placement on 7/27  Ercp  on 7/29  Antimicrobials: Anti-infectives (From admission, onward)   Start     Dose/Rate Route Frequency Ordered Stop   11/25/17 1730  Ampicillin-Sulbactam (UNASYN) 3 g in sodium chloride 0.9 % 100 mL IVPB     3 g 200 mL/hr over 30 Minutes Intravenous Every 6 hours 11/25/17 1643     11/23/17 1200  vancomycin (VANCOCIN) IVPB 750 mg/150 ml premix  Status:  Discontinued     750 mg 150 mL/hr over  60 Minutes Intravenous Every 12 hours 11/23/17 0442 11/23/17 1245   11/23/17 0400  piperacillin-tazobactam (ZOSYN) IVPB 3.375 g  Status:  Discontinued     3.375 g 100 mL/hr over 30 Minutes Intravenous  Once 11/23/17 0345 11/23/17 0353   11/23/17 0400  vancomycin (VANCOCIN) IVPB 1000 mg/200  mL premix  Status:  Discontinued     1,000 mg 200 mL/hr over 60 Minutes Intravenous  Once 11/23/17 0345 11/23/17 0353   11/23/17 0400  piperacillin-tazobactam (ZOSYN) IVPB 3.375 g  Status:  Discontinued     3.375 g 12.5 mL/hr over 240 Minutes Intravenous Every 8 hours 11/23/17 0354 11/25/17 1637      Subjective: Eager to go home  Objective: Vitals:   11/27/17 0430 11/27/17 0636 11/27/17 0847 11/27/17 1249  BP: 135/72  (!) 115/95 133/68  Pulse: 67  (!) 108   Resp: 15  18 18   Temp: 98 F (36.7 C) 98.1 F (36.7 C)  98.7 F (37.1 C)  TempSrc: Oral     SpO2: 95%  98%   Weight:  83.5 kg (184 lb 1.6 oz)    Height:        Intake/Output Summary (Last 24 hours) at 11/27/2017 1525 Last data filed at 11/27/2017 1300 Gross per 24 hour  Intake 200 ml  Output 750 ml  Net -550 ml   Filed Weights   11/25/17 0442 11/26/17 0513 11/27/17 0636  Weight: 86.5 kg (190 lb 11.2 oz) 86.4 kg (190 lb 7.6 oz) 83.5 kg (184 lb 1.6 oz)    Examination: General exam: Conversant, in no acute distress Respiratory system: normal chest rise, clear, no audible wheezing Cardiovascular system: regular rhythm, s1-s2 Gastrointestinal system: Nondistended, nontender, pos BS Central nervous system: No seizures, no tremors Extremities: No cyanosis, no joint deformities Skin: No rashes, no pallor Psychiatry: Affect normal // no auditory hallucinations   Data Reviewed: I have personally reviewed following labs and imaging studies  CBC: Recent Labs  Lab 11/23/17 0505 11/24/17 0307 11/24/17 1820 11/25/17 0433 11/26/17 0414 11/27/17 0359  WBC 14.1* 11.7*  --  15.0* 8.6 4.7  NEUTROABS 12.9*  --   --   --   --   --   HGB 11.5* 10.7* 10.7* 9.9* 8.6* 8.5*  HCT 34.2* 32.4* 32.3* 29.5* 25.4* 25.7*  MCV 87.9 89.8  --  86.8 87.0 90.2  PLT 103* 120*  --  145* 131* 141*   Basic Metabolic Panel: Recent Labs  Lab 11/23/17 0505 11/24/17 0307 11/25/17 0433 11/26/17 0414 11/27/17 0359  NA 137 139 136 138 142    K 4.1 3.9 4.4 3.6 3.7  CL 107 109 107 107 108  CO2 19* 23 24 27 28   GLUCOSE 159* 113* 298* 183* 168*  BUN 21 19 26* 14 7*  CREATININE 1.28* 1.08 1.10 0.91 0.84  CALCIUM 7.0* 6.9* 7.3* 7.4* 7.6*  MG  --   --   --   --  1.7   GFR: Estimated Creatinine Clearance: 89.6 mL/min (by C-G formula based on SCr of 0.84 mg/dL). Liver Function Tests: Recent Labs  Lab 11/23/17 0505 11/24/17 0307 11/25/17 0433 11/26/17 0414  AST 91* 58* 28 24  ALT 125* 92* 67* 52*  ALKPHOS 106 94 85 71  BILITOT 5.9* 3.4* 2.3* 2.2*  PROT 5.3* 5.2* 5.6* 5.3*  ALBUMIN 2.6* 2.3* 2.5* 2.4*   Recent Labs  Lab 11/24/17 0307  LIPASE 62*   No results for input(s): AMMONIA in the last 168 hours. Coagulation Profile: Recent Labs  Lab 11/23/17 0505 11/26/17 0414  INR 1.92 1.22   Cardiac Enzymes: No results for input(s): CKTOTAL, CKMB, CKMBINDEX, TROPONINI in the last 168 hours. BNP (last 3 results) No results for input(s): PROBNP in the last 8760 hours. HbA1C: No results for input(s): HGBA1C in the last 72 hours. CBG: Recent Labs  Lab 11/26/17 1643 11/26/17 2035 11/27/17 0018 11/27/17 0757 11/27/17 1234  GLUCAP 198* 223* 170* 151* 193*   Lipid Profile: No results for input(s): CHOL, HDL, LDLCALC, TRIG, CHOLHDL, LDLDIRECT in the last 72 hours. Thyroid Function Tests: Recent Labs    11/25/17 0433  TSH 1.543   Anemia Panel: No results for input(s): VITAMINB12, FOLATE, FERRITIN, TIBC, IRON, RETICCTPCT in the last 72 hours. Sepsis Labs: Recent Labs  Lab 11/23/17 0505 11/23/17 0713 11/24/17 0307  PROCALCITON 3.73  --   --   LATICACIDVEN 1.9 1.6 1.1    Recent Results (from the past 240 hour(s))  Culture, blood (x 2)     Status: None (Preliminary result)   Collection Time: 11/23/17  4:54 AM  Result Value Ref Range Status   Specimen Description BLOOD LEFT ARM  Final   Special Requests   Final    BOTTLES DRAWN AEROBIC ONLY Blood Culture results may not be optimal due to an inadequate  volume of blood received in culture bottles   Culture   Final    NO GROWTH 4 DAYS Performed at [name], [address], [place], [place] [address]    Report Status PENDING  Incomplete  Culture, blood (x 2)     Status: None (Preliminary result)   Collection Time: 11/23/17  5:15 AM  Result Value Ref Range Status   Specimen Description BLOOD LEFT ARM  Final   Special Requests   Final    BOTTLES DRAWN AEROBIC ONLY Blood Culture results may not be optimal due to an inadequate volume of blood received in culture bottles   Culture   Final    NO GROWTH 4 DAYS Performed at [facility], [address], [place], [place] [address]    Report Status PENDING  Incomplete  MRSA PCR Screening     Status: None   Collection Time: 11/23/17  6:54 AM  Result Value Ref Range Status   MRSA by PCR NEGATIVE NEGATIVE Final    Comment:        The GeneXpert MRSA Assay (FDA approved for NASAL specimens only), is one component of a comprehensive MRSA colonization surveillance program. It is not intended to diagnose MRSA infection nor to guide or monitor treatment for MRSA infections. Performed at [facility], [address], [place], [place] [address]      Radiology Studies: No results found.  Scheduled Meds: . digoxin  0.125 mg Intravenous Daily  . insulin aspart  0-9 Units Subcutaneous TID AC & HS  . metoprolol tartrate  12.5 mg Oral BID   Continuous Infusions: . sodium chloride 10 mL/hr at 11/26/17 1228  . ampicillin-sulbactam (UNASYN) IV 3 g (11/27/17 1108)     LOS: 4 days   Rickey Barbara, MD [facility] Pager [phone]  If 7PM-7AM, please contact night-coverage [email or web address] Password [email or web address] 11/27/2017, 3:25 PM

## 2017-11-27 NOTE — Progress Notes (Addendum)
Patient's HR in 110-120s. Rhythm changed from NSR to Afib. No c/o pain or discomfort. Notified MD. New order placed for STAT EKG. Will implement order and continue to monitor patient.

## 2017-11-28 LAB — GLUCOSE, CAPILLARY
Glucose-Capillary: 203 mg/dL — ABNORMAL HIGH (ref 70–99)
Glucose-Capillary: 260 mg/dL — ABNORMAL HIGH (ref 70–99)

## 2017-11-28 LAB — CULTURE, BLOOD (ROUTINE X 2)
CULTURE: NO GROWTH
Culture: NO GROWTH

## 2017-11-28 LAB — CBC
HEMATOCRIT: 27.9 % — AB (ref 39.0–52.0)
Hemoglobin: 9.4 g/dL — ABNORMAL LOW (ref 13.0–17.0)
MCH: 29.9 pg (ref 26.0–34.0)
MCHC: 33.7 g/dL (ref 30.0–36.0)
MCV: 88.9 fL (ref 78.0–100.0)
PLATELETS: 174 10*3/uL (ref 150–400)
RBC: 3.14 MIL/uL — ABNORMAL LOW (ref 4.22–5.81)
RDW: 14.4 % (ref 11.5–15.5)
WBC: 4.7 10*3/uL (ref 4.0–10.5)

## 2017-11-28 LAB — BASIC METABOLIC PANEL
Anion gap: 6 (ref 5–15)
BUN: 5 mg/dL — ABNORMAL LOW (ref 8–23)
CALCIUM: 8.3 mg/dL — AB (ref 8.9–10.3)
CO2: 29 mmol/L (ref 22–32)
Chloride: 103 mmol/L (ref 98–111)
Creatinine, Ser: 0.82 mg/dL (ref 0.61–1.24)
GFR calc Af Amer: >60 mL/min (ref >60)
GFR calc non Af Amer: >60 mL/min (ref >60)
GLUCOSE: 204 mg/dL — AB (ref 70–99)
Potassium: 3.9 mmol/L (ref 3.5–5.1)
Sodium: 138 mmol/L (ref 135–145)

## 2017-11-28 LAB — MAGNESIUM: Magnesium: 1.8 mg/dL (ref 1.7–2.4)

## 2017-11-28 MED ORDER — METOPROLOL TARTRATE 25 MG PO TABS: 25.0000 mg | ORAL_TABLET | Freq: Two times a day (BID) | ORAL | 0 refills | Status: AC

## 2017-11-28 MED ORDER — AMOXICILLIN-POT CLAVULANATE 875-125 MG PO TABS: 1.0000 | ORAL_TABLET | Freq: Two times a day (BID) | ORAL | 0 refills | Status: AC

## 2017-11-28 MED ORDER — DIGOXIN 125 MCG PO TABS: 125.0000 ug | ORAL_TABLET | Freq: Every day | ORAL | 0 refills | Status: AC

## 2017-11-28 NOTE — Plan of Care (Signed)
  Problem: Education: Goal: Knowledge of General Education information will improve Description Including pain rating scale, medication(s)/side effects and non-pharmacologic comfort measures Outcome: Progressing   Problem: Health Behavior/Discharge Planning: Goal: Ability to manage health-related needs will improve Outcome: Progressing   Problem: Clinical Measurements: Goal: Ability to maintain clinical measurements within normal limits will improve Outcome: Progressing Goal: Will remain free from infection Outcome: Progressing   Problem: Safety: Goal: Ability to remain free from injury will improve Outcome: Progressing   Problem: Respiratory: Goal: Ability to maintain adequate ventilation will improve Outcome: Progressing

## 2017-11-28 NOTE — Discharge Summary (Signed)
Physician Discharge Summary  Nahun Kronberg ZOX:[id number] DOB: March 03, 1951 DOA: 11/23/2017  PCP: System, Pcp Not In  Admit date: 11/23/2017 Discharge date: 11/28/2017  Admitted From: Home Disposition:  Home  Recommendations for Outpatient Follow-up:  1. Follow up with PCP in 1-2 weeks 2. Please monitor blood pressures. Cozaar on home med rec not continued secondary to earlier soft BP.  Discharge Condition:Improved CODE STATUS:Full Diet recommendation: Soft, diabetic   Brief/Interim Summary: 67 y.o. male with history of CAD status post CABG, hypertension, diabetes mellitus type 2, abdominal aortic aneurysm status post repair with history of embolism to left femoral artery status post embolectomy this February 5 months ago on apixaban which patient has not taken for last 2 to 3 days due to persistent nausea vomiting abdominal pain.  Patient states he had cholecystectomy 3 years ago and since then he has developed some constant chronic abdominal discomfort mostly in the epigastric area.  But over the last 3 days pain is acutely worsened with persistent nausea and vomiting.  Denies any diarrhea.  Pain is mostly in the epigastric area.  Has had some subjective feeling of fever and chills.  Denies any chest pain or shortness of breath or productive cough.  In the ER at [facility] patient was initially hypotensive look septic with the labs showing WBC count of 13,000 total bilirubin of 7 ALT of 200 AST of 170 albumin was 3.2 creatinine of 1.4 lipase normal INR 2.2 platelets 141 hemoglobin 13 patient was placed on subclavian central line and at least 5 L of fluid bolus was given following which patient blood pressure improved.  CAT scan done showed extra and intrahepatic biliary duct dilation with possible obstruction distal CBD.  Since officially does not have any gastroenterologist gastroenterologist at [facility] Dr. Randa Evens was consulted and was accepted for further  management to [facility] and may need an ERCP.  On my exam patient states his abdominal pain is resolved but is pending to be coming back.  Pain is mostly epigastric radiating across the upper part of the abdomen.  Persistent nausea vomiting unable to take his medications.  Denies chest pain shortness of breath productive cough or diarrhea.  Severe sepsis with ecoli bacteremia, source likely from cholangitis /cbd stone -H/o cholecystectomy 2yrs ago -he presented to outside hospital with n/v/ab pain and severe sepsis --he had central line placed and received 5liter fluids bolus at outside hospital prior to transfer. cxr here central line at distal SVC. cxr "Mild cardiomegaly. Slight increased interstitial markings may be chronic or mild pulmonary edema" He receivedvancinitially, he has been onzosyn,blood culture from outside hospital finalized today with pansensitive E. Coli,antibiotic change to Unasyn to cover anaerobes in the setting of cholangitis with plan to transition to PO augmentin at time of discharge to complete total 2 weeks of abx - ercp on 7/29 with CBD stone removal and sphincterectomy -GI continues to follow. Discussed with GI. Recommendation to follow up in office in 2-3 weeks  Blood in stool after ERCP/anemia -patient reports gradually improving blood per rectum -Hgb remains stable. No plan for follow up endoscopy  -Repeat CBC with improvement in HGB up to over 9  Thrombocytopenia Suspect related to acute infection Improving  Acute hypoxia, o2 sat on presentation was 84%, he is on 2liter o2, denies sob, no edema, lung exam clear -cxr "Mild cardiomegaly. Slight increased interstitial markings may be chronic or mild pulmonary edema" -Patient had been weaned to room air  Elevated Cr ,  likely AKI, No baseline, Cr improving, labs reviewed  noninsulin dependent dm2 a1c 8 Home metformin had been held while in hospital, continue on ssi while in  hospital Resume home meds on discharge  CAD s/p CABG in 2000 Denies chest pain cxr "Mild cardiomegaly. Slight increased interstitial markings may be chronic or mild pulmonary edema" Stable at this time. No chest pain this AM  Afib, new diagnosis -denies prior h/o of afib -Keep on tele, resume lopressor at decreased dose, titrate up if bp allows -keep K.4, mag>2 -anticoagulation need to be held for 5 days post ERCp/sphinctorectomy per GI - echocardiogram - Cardiology was consulted "Medication Recommendations:Metoprolol 12.5 mg BID, apixaban when appropriate per GI" -anticoagulant remains on hold. Per GI, continue to hold x another 3 days - on 8/1, patient noted to be in RVR, started on scheduled digoxin - bp now trending up, will discharge on metoprolol 25mg  bid  Hypomagnesemia, Hypokalemia -K replaced -Mg replaced  H/o abdominal aortic aneurysm status post endovascular repair (05/2017)complicated with embolization to left femoral artery on apixabanwhich patient has not taken for last 2 days due to persistent vomiting. Apixaban remains on hold as per above.CT scan does not show any concerning features for the abdominal aortic aneurysm.     Discharge Diagnoses:  Principal Problem:   Sepsis (HCC) Active Problems:   Jaundice   Elevated LFTs   CAD (coronary artery disease)   AAA (abdominal aortic aneurysm) (HCC)   Essential hypertension   Type 2 diabetes mellitus with vascular disease (HCC)    Discharge Instructions   Allergies as of 11/28/2017   No Known Allergies     Medication List    STOP taking these medications   losartan 50 MG tablet Commonly known as:  COZAAR     TAKE these medications   amoxicillin-clavulanate 875-125 MG tablet Commonly known as:  AUGMENTIN Take 1 tablet by mouth 2 (two) times daily for 8 days.   apixaban 5 MG Tabs tablet Commonly known as:  ELIQUIS Take 5 mg by mouth 2 (two) times daily.   aspirin EC 81 MG tablet Take 81 mg  by mouth daily.   atorvastatin 40 MG tablet Commonly known as:  LIPITOR Take 40 mg by mouth daily.   digoxin 0.125 MG tablet Commonly known as:  LANOXIN Take 1 tablet (125 mcg total) by mouth daily.   gabapentin 100 MG capsule Commonly known as:  NEURONTIN Take 100 mg by mouth 3 (three) times daily.   metFORMIN 500 MG 24 hr tablet Commonly known as:  GLUCOPHAGE-XR Take 1,000 mg by mouth 2 (two) times daily.   metoprolol tartrate 25 MG tablet Commonly known as:  LOPRESSOR Take 1 tablet (25 mg total) by mouth 2 (two) times daily. What changed:    medication strength  how much to take   sitaGLIPtin 100 MG tablet Commonly known as:  JANUVIA Take 100 mg by mouth daily.      Follow-up Information    Follow up with your PCP in 1-2 weeks. Schedule an appointment as soon as possible for a visit.        Follow up with your Cardiologist as scheduled Follow up.        Willis Modena, MD. Call in 2 week(s).   Specialty:  Gastroenterology Why:  to arrange appointment Contact information: [address] [place] [address] [phone]          No Known Allergies  Consultations:  Cardiology  GI  Procedures/Studies: Dg Chest  Port 1 View  Result Date: 11/23/2017 CLINICAL DATA:  Sepsis. EXAM: PORTABLE CHEST 1 VIEW COMPARISON:  None. FINDINGS: Right central line with tip overlying the distal SVC. Post median sternotomy and CABG. Mild hyperinflation. Mild cardiomegaly. Slight increase interstitial markings. No confluent airspace disease. No pneumothorax or large pleural effusion. No acute osseous abnormalities are seen. IMPRESSION: 1. Mild cardiomegaly. Slight increased interstitial markings may be chronic or mild pulmonary edema. 2. Tip of the right central line overlying the distal SVC. Electronically Signed   By: Rubye Oaks M.D.   On: 11/23/2017 04:19   Dg Ercp Biliary & Pancreatic Ducts  Result Date: 11/24/2017 CLINICAL DATA:  ERCP with  sphincterotomy. EXAM: ERCP TECHNIQUE: Multiple spot images obtained with the fluoroscopic device and submitted for interpretation post-procedure. FLUOROSCOPY TIME:  3 minutes, 52 seconds COMPARISON:  None. FINDINGS: 6 spot intraoperative fluoroscopic images of the right upper abdominal quadrant during ERCP are provided for review. Initial image demonstrates an ERCP probe overlying the right upper abdominal quadrant. Subsequent images demonstrate opacification of the common bile duct which appears moderate to markedly dilated. There are at least a 5 filling defects within the common bile duct worrisome for nonocclusive choledocholithiasis Subsequent image demonstrates insufflation of a balloon within the central aspect of the CBD with subsequent presumed biliary sweeping and biliary sphincterotomy. There is minimal opacification of the central aspect of the intrahepatic biliary tree which appears mildly dilated. There is faint opacification of the central aspect of the cystic duct. There is no definitive opacification of the pancreatic duct. IMPRESSION: ERCP with findings of suspected choledocholithiasis with subsequent presumed biliary sweeping and sphincterotomy as above. These images were submitted for radiologic interpretation only. Please see the procedural report for the amount of contrast and the fluoroscopy time utilized. Electronically Signed   By: Simonne Come M.D.   On: 11/24/2017 10:58    Subjective: Eager to go home  Discharge Exam: Vitals:   11/28/17 0501 11/28/17 0759  BP:  (!) 145/70  Pulse:  73  Resp:  (!) 24  Temp: 98.3 F (36.8 C) 98.1 F (36.7 C)  SpO2:  97%   Vitals:   11/27/17 2211 11/28/17 0211 11/28/17 0501 11/28/17 0759  BP: (!) 144/66 (!) 144/69  (!) 145/70  Pulse: 67 64  73  Resp: (!) 24 (!) 21  (!) 24  Temp: 98.1 F (36.7 C) 98 F (36.7 C) 98.3 F (36.8 C) 98.1 F (36.7 C)  TempSrc: Oral Oral Oral Oral  SpO2: 91% 90%  97%  Weight:   81.6 kg (179 lb 14.3 oz)    Height:        General: Pt is alert, awake, not in acute distress Cardiovascular: RRR, S1/S2 +, no rubs, no gallops Respiratory: CTA bilaterally, no wheezing, no rhonchi Abdominal: Soft, NT, ND, bowel sounds + Extremities: no edema, no cyanosis   The results of significant diagnostics from this hospitalization (including imaging, microbiology, ancillary and laboratory) are listed below for reference.     Microbiology: Recent Results (from the past 240 hour(s))  Culture, blood (x 2)     Status: None   Collection Time: 11/23/17  4:54 AM  Result Value Ref Range Status   Specimen Description BLOOD LEFT ARM  Final   Special Requests   Final    BOTTLES DRAWN AEROBIC ONLY Blood Culture results may not be optimal due to an inadequate volume of blood received in culture bottles   Culture   Final    NO GROWTH 5 DAYS  Performed at [facility], [address], [place], [place] [address]    Report Status 11/28/2017 FINAL  Final  Culture, blood (x 2)     Status: None   Collection Time: 11/23/17  5:15 AM  Result Value Ref Range Status   Specimen Description BLOOD LEFT ARM  Final   Special Requests   Final    BOTTLES DRAWN AEROBIC ONLY Blood Culture results may not be optimal due to an inadequate volume of blood received in culture bottles   Culture   Final    NO GROWTH 5 DAYS Performed at [facility], [address] [place], [place] [address]    Report Status 11/28/2017 FINAL  Final  MRSA PCR Screening     Status: None   Collection Time: 11/23/17  6:54 AM  Result Value Ref Range Status   MRSA by PCR NEGATIVE NEGATIVE Final    Comment:        The GeneXpert MRSA Assay (FDA approved for NASAL specimens only), is one component of a comprehensive MRSA colonization surveillance program. It is not intended to diagnose MRSA infection nor to guide or monitor treatment for MRSA infections. Performed at [facility], [address] [place], [place] [address]       Labs: BNP (last 3 results) No results for input(s): BNP in the last 8760 hours. Basic Metabolic Panel: Recent Labs  Lab 11/24/17 0307 11/25/17 0433 11/26/17 0414 11/27/17 0359 11/28/17 0411  NA 139 136 138 142 138  K 3.9 4.4 3.6 3.7 3.9  CL 109 107 107 108 103  CO2 23 24 27 28 29   GLUCOSE 113* 298* 183* 168* 204*  BUN 19 26* 14 7* 5*  CREATININE 1.08 1.10 0.91 0.84 0.82  CALCIUM 6.9* 7.3* 7.4* 7.6* 8.3*  MG  --   --   --  1.7 1.8   Liver Function Tests: Recent Labs  Lab 11/23/17 0505 11/24/17 0307 11/25/17 0433 11/26/17 0414  AST 91* 58* 28 24  ALT 125* 92* 67* 52*  ALKPHOS 106 94 85 71  BILITOT 5.9* 3.4* 2.3* 2.2*  PROT 5.3* 5.2* 5.6* 5.3*  ALBUMIN 2.6* 2.3* 2.5* 2.4*   Recent Labs  Lab 11/24/17 0307  LIPASE 62*   No results for input(s): AMMONIA in the last 168 hours. CBC: Recent Labs  Lab 11/23/17 0505 11/24/17 0307 11/24/17 1820 11/25/17 0433 11/26/17 0414 11/27/17 0359 11/28/17 0411  WBC 14.1* 11.7*  --  15.0* 8.6 4.7 4.7  NEUTROABS 12.9*  --   --   --   --   --   --   HGB 11.5* 10.7* 10.7* 9.9* 8.6* 8.5* 9.4*  HCT 34.2* 32.4* 32.3* 29.5* 25.4* 25.7* 27.9*  MCV 87.9 89.8  --  86.8 87.0 90.2 88.9  PLT 103* 120*  --  145* 131* 141* 174   Cardiac Enzymes: No results for input(s): CKTOTAL, CKMB, CKMBINDEX, TROPONINI in the last 168 hours. BNP: Invalid input(s): POCBNP CBG: Recent Labs  Lab 11/27/17 1234 11/27/17 1708 11/27/17 2042 11/28/17 0812 11/28/17 1150  GLUCAP 193* 255* 224* 203* 260*   D-Dimer No results for input(s): DDIMER in the last 72 hours. Hgb A1c No results for input(s): HGBA1C in the last 72 hours. Lipid Profile No results for input(s): CHOL, HDL, LDLCALC, TRIG, CHOLHDL, LDLDIRECT in the last 72 hours. Thyroid function studies No results for input(s): TSH, T4TOTAL, T3FREE, THYROIDAB in the last 72 hours.  Invalid input(s): FREET3 Anemia work up No results  for input(s): VITAMINB12, FOLATE, FERRITIN, TIBC, IRON,  RETICCTPCT in the last 72 hours. Urinalysis    Component Value Date/Time   COLORURINE AMBER (A) 11/23/2017 2101   APPEARANCEUR HAZY (A) 11/23/2017 2101   LABSPEC 1.028 11/23/2017 2101   PHURINE 5.0 11/23/2017 2101   GLUCOSEU NEGATIVE 11/23/2017 2101   HGBUR NEGATIVE 11/23/2017 2101   BILIRUBINUR SMALL (A) 11/23/2017 2101   KETONESUR NEGATIVE 11/23/2017 2101   PROTEINUR 30 (A) 11/23/2017 2101   NITRITE NEGATIVE 11/23/2017 2101   LEUKOCYTESUR NEGATIVE 11/23/2017 2101   Sepsis Labs Invalid input(s): PROCALCITONIN,  WBC,  LACTICIDVEN Microbiology Recent Results (from the past 240 hour(s))  Culture, blood (x 2)     Status: None   Collection Time: 11/23/17  4:54 AM  Result Value Ref Range Status   Specimen Description BLOOD LEFT ARM  Final   Special Requests   Final    BOTTLES DRAWN AEROBIC ONLY Blood Culture results may not be optimal due to an inadequate volume of blood received in culture bottles   Culture   Final    NO GROWTH 5 DAYS Performed at [facility], [address] [place], [place] [address]    Report Status 11/28/2017 FINAL  Final  Culture, blood (x 2)     Status: None   Collection Time: 11/23/17  5:15 AM  Result Value Ref Range Status   Specimen Description BLOOD LEFT ARM  Final   Special Requests   Final    BOTTLES DRAWN AEROBIC ONLY Blood Culture results may not be optimal due to an inadequate volume of blood received in culture bottles   Culture   Final    NO GROWTH 5 DAYS Performed at [facility], [address] [place], [place] [address]    Report Status 11/28/2017 FINAL  Final  MRSA PCR Screening     Status: None   Collection Time: 11/23/17  6:54 AM  Result Value Ref Range Status   MRSA by PCR NEGATIVE NEGATIVE Final    Comment:        The GeneXpert MRSA Assay (FDA approved for NASAL specimens only), is one component of a comprehensive MRSA colonization surveillance program. It is not intended to diagnose MRSA infection nor to guide  or monitor treatment for MRSA infections. Performed at [facility], [address] [place], [place] [address]    Time spent: 30min  SIGNED:   Rickey BarbaraStephen Yitzchak Kothari, MD  [facility] 11/28/2017, 11:59 AM  If 7PM-7AM, please contact night-coverage [email or web address] Password [email or web address]

## 2017-11-28 NOTE — Discharge Instructions (Signed)
Please resume aspirin and eliquis on 8/4  Information on my medicine - ELIQUIS (apixaban)-  Restart your Eliquis on 11/30/17 (new diagnosis of Atrial fibrillation)  This medication education was reviewed with me or my healthcare representative as part of my discharge preparation.   Why was Eliquis prescribed for you? Eliquis was prescribed for you to reduce the risk of a blood clot forming that can cause a stroke if you have a medical condition called atrial fibrillation (a type of irregular heartbeat).  What do You need to know about Eliquis ? Take your Eliquis TWICE DAILY - one tablet in the morning and one tablet in the evening with or without food. If you have difficulty swallowing the tablet whole please discuss with your pharmacist how to take the medication safely.  Take Eliquis exactly as prescribed by your doctor and DO NOT stop taking Eliquis without talking to the doctor who prescribed the medication.  Stopping may increase your risk of developing a stroke.  Refill your prescription before you run out.  After discharge, you should have regular check-up appointments with your healthcare provider that is prescribing your Eliquis.  In the future your dose may need to be changed if your kidney function or weight changes by a significant amount or as you get older.  What do you do if you miss a dose? If you miss a dose, take it as soon as you remember on the same day and resume taking twice daily.  Do not take more than one dose of ELIQUIS at the same time to make up a missed dose.  Important Safety Information A possible side effect of Eliquis is bleeding. You should call your healthcare provider right away if you experience any of the following: ? Bleeding from an injury or your nose that does not stop. ? Unusual colored urine (red or dark brown) or unusual colored stools (red or black). ? Unusual bruising for unknown reasons. ? A serious fall or if you hit your head (even if  there is no bleeding).  Some medicines may interact with Eliquis and might increase your risk of bleeding or clotting while on Eliquis. To help avoid this, consult your healthcare provider or pharmacist prior to using any new prescription or non-prescription medications, including herbals, vitamins, non-steroidal anti-inflammatory drugs (NSAIDs) and supplements.  This website has more information on Eliquis (apixaban): http://www.eliquis.com/eliquis/home

## 2017-11-28 NOTE — Progress Notes (Signed)
Patient was discharged home by MD order; discharged instructions  review and give to patient with care notes and prescriptions; IV DIC; skin intact; patient will be escorted to the car by a vounteer via wheelchair.

## 2019-05-08 IMAGING — RF DG ERCP WO/W SPHINCTEROTOMY
1 series · 6 of 6 positions shown · non-contrast
Comparison: None.

CLINICAL DATA: ERCP with sphincterotomy.

EXAM:
ERCP
TECHNIQUE: Multiple spot images obtained with the fluoroscopic device and
submitted for interpretation post-procedure.
FLUOROSCOPY TIME:  3 minutes, 52 seconds

[Series 1: run · 6 of 6 slices shown]
[im 1/6]
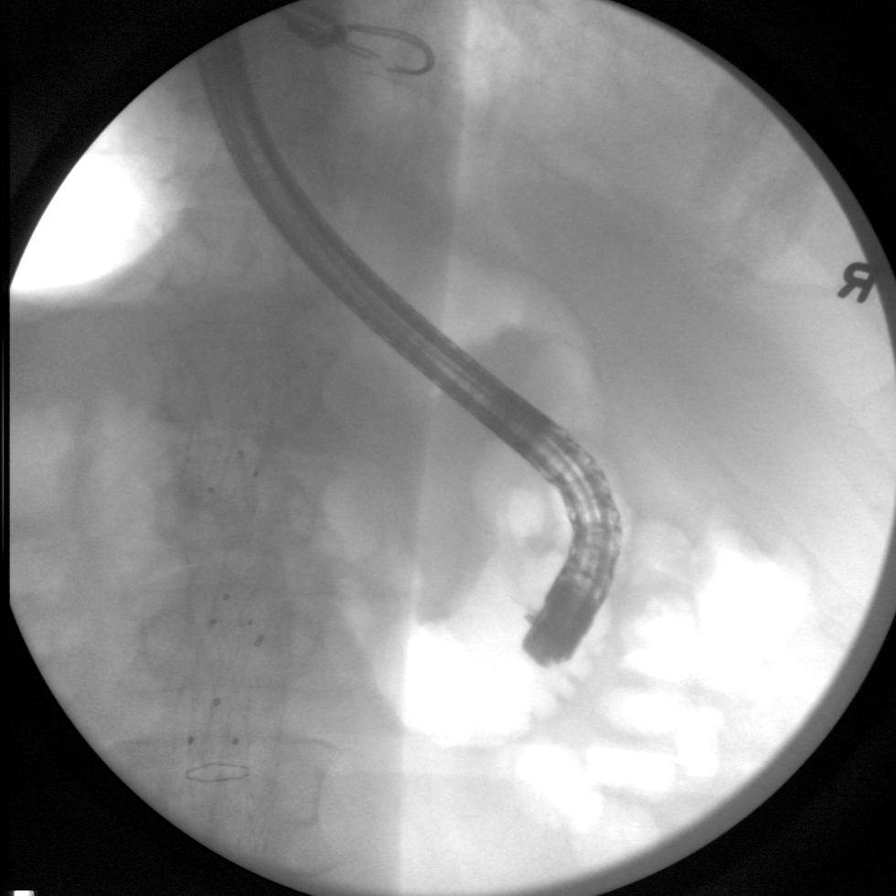
[im 2/6]
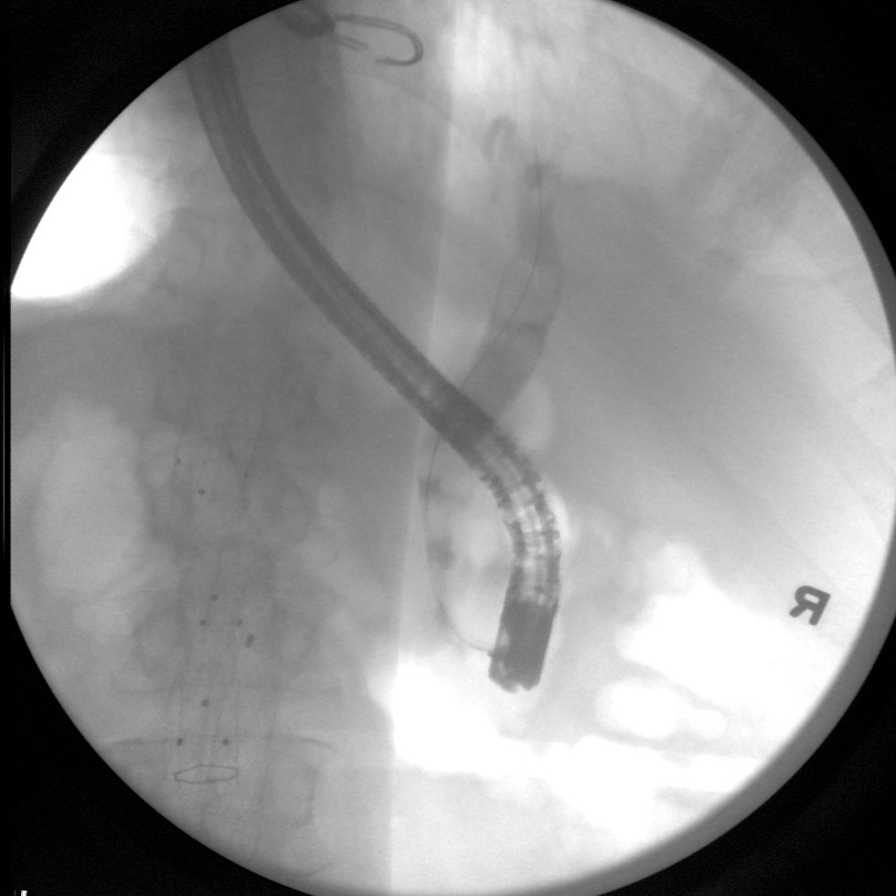
[im 3/6]
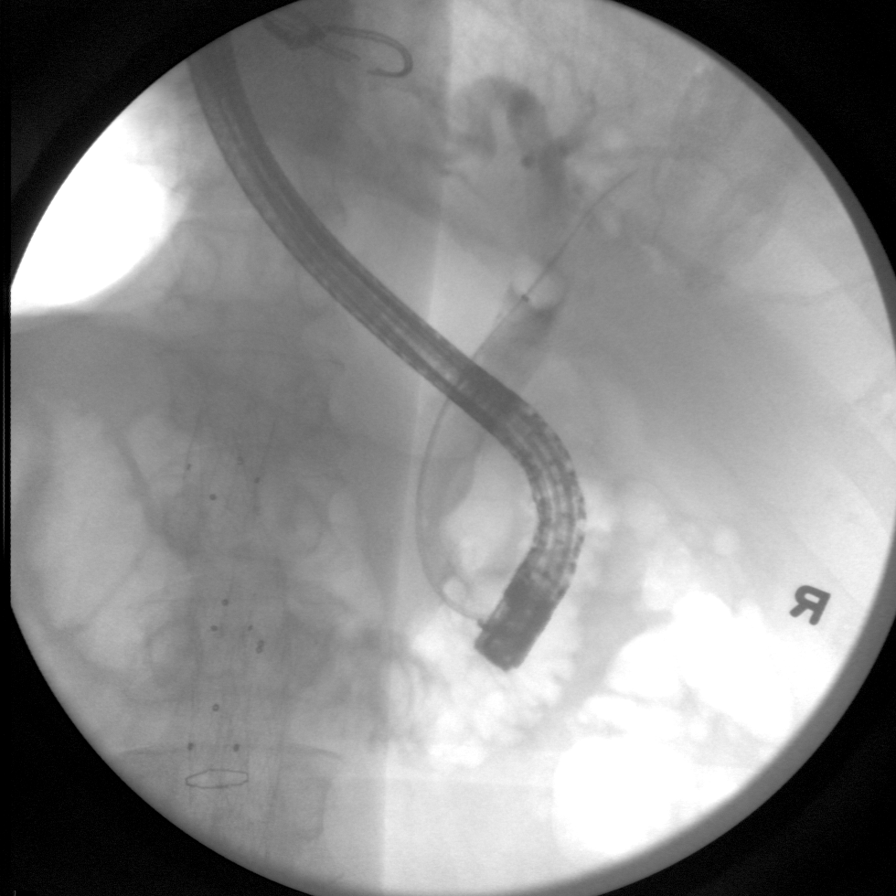
[im 4/6]
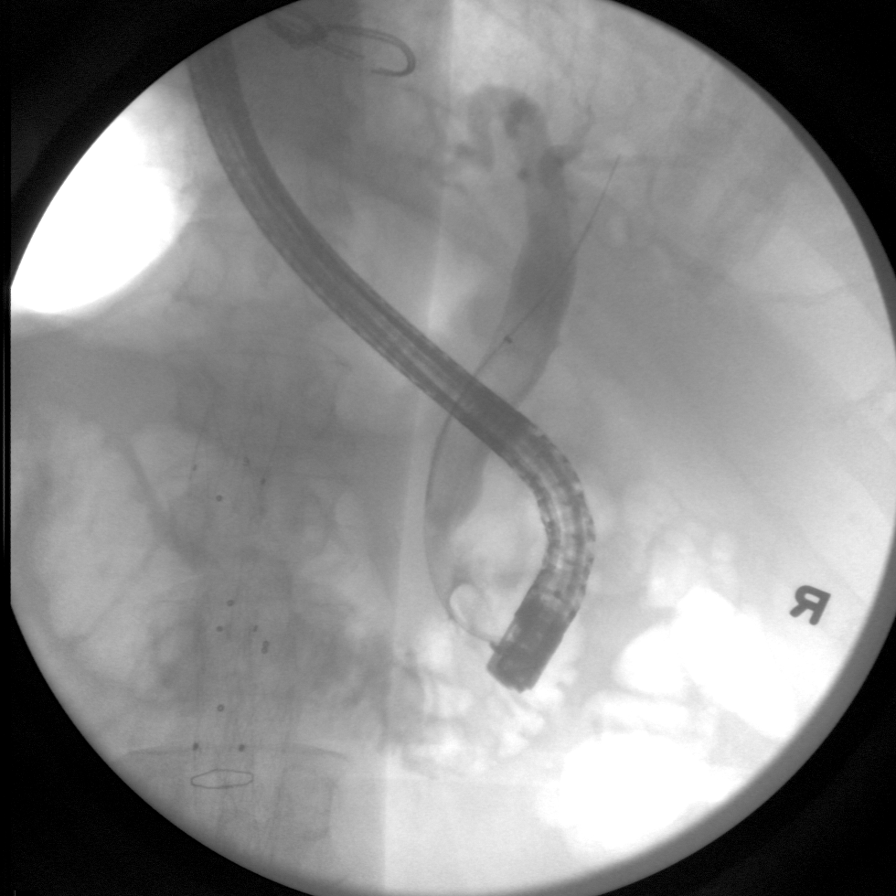
[im 5/6]
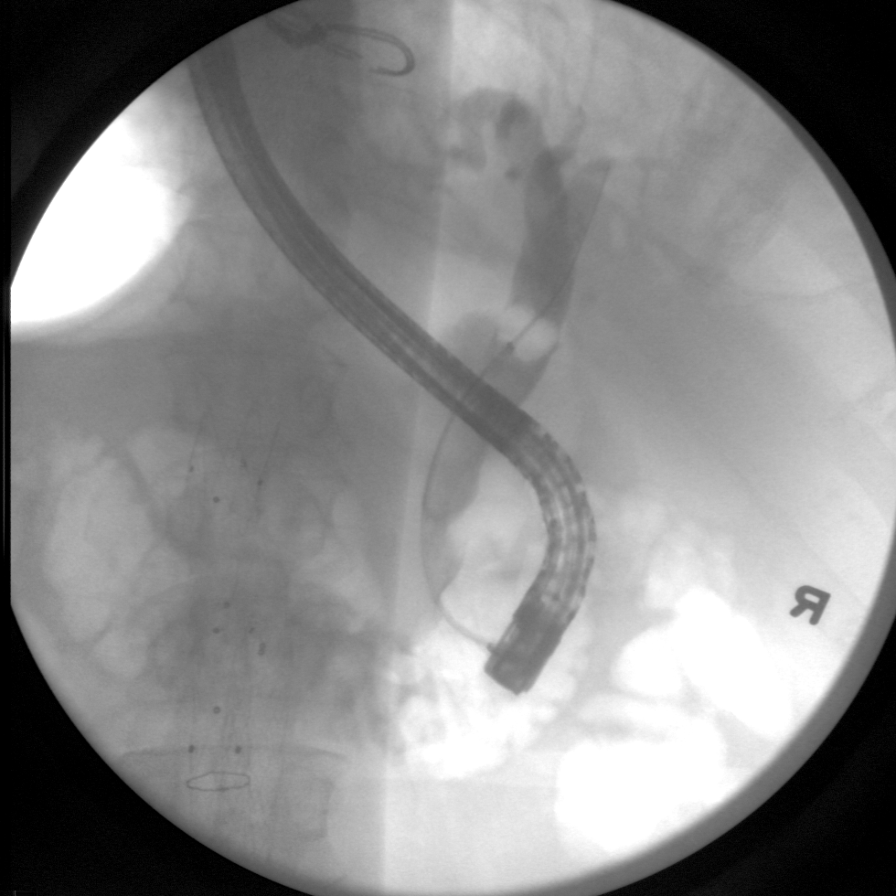
[im 6/6]
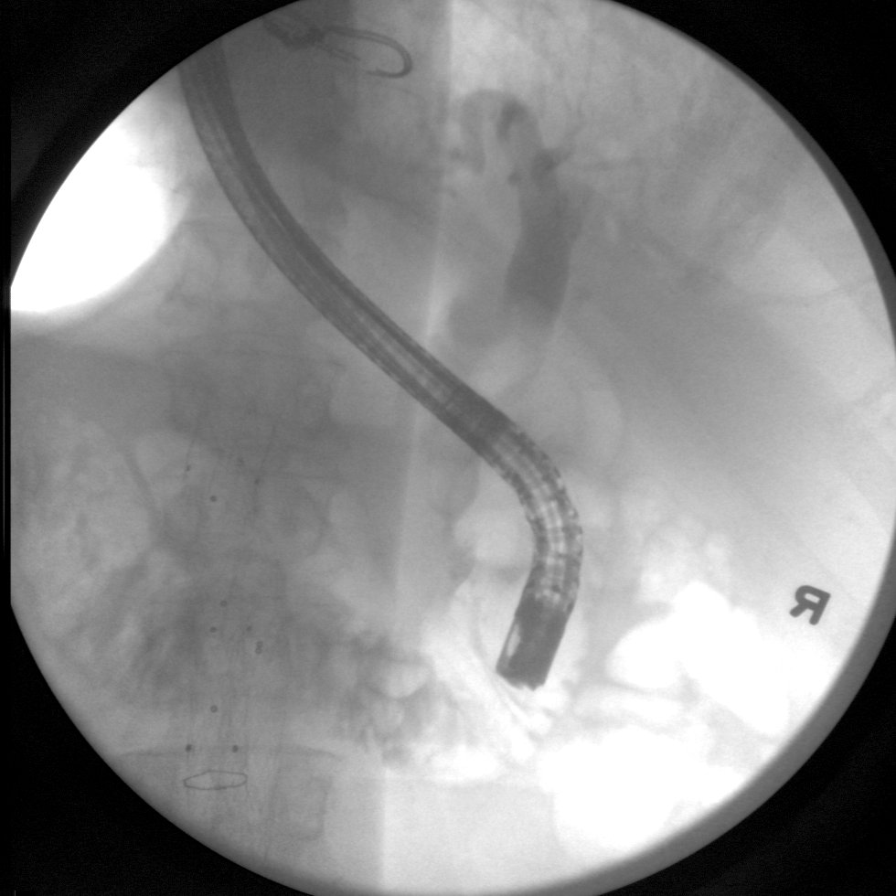

[6 of 6 positions shown; findings below may reference images not displayed]

FINDINGS: 6 spot intraoperative fluoroscopic images of the right upper
abdominal quadrant during ERCP are provided for review.

Initial image demonstrates an ERCP probe overlying the right upper
abdominal quadrant.

Subsequent images demonstrate opacification of the common bile duct
which appears moderate to markedly dilated.

There are at least a 5 filling defects within the common bile duct
worrisome for nonocclusive choledocholithiasis

Subsequent image demonstrates insufflation of a balloon within the
central aspect of the CBD with subsequent presumed biliary sweeping
and biliary sphincterotomy.

There is minimal opacification of the central aspect of the
intrahepatic biliary tree which appears mildly dilated. There is
faint opacification of the central aspect of the cystic duct. There
is no definitive opacification of the pancreatic duct.
IMPRESSION: ERCP with findings of suspected choledocholithiasis with subsequent
presumed biliary sweeping and sphincterotomy as above.

These images were submitted for radiologic interpretation only.
Please see the procedural report for the amount of contrast and the
fluoroscopy time utilized.

## 2020-04-29 DEATH — deceased
